# Patient Record
Sex: Male | Born: 1940 | Race: White | Hispanic: No | Marital: Married | Smoking: Never smoker
Health system: Southern US, Community
[De-identification: ages and names within clinical notes are randomized; demographics above are authoritative.]

## PROBLEM LIST (undated history)

## (undated) DIAGNOSIS — B029 Zoster without complications: Secondary | ICD-10-CM

## (undated) DIAGNOSIS — I313 Pericardial effusion (noninflammatory): Secondary | ICD-10-CM

## (undated) DIAGNOSIS — I712 Thoracic aortic aneurysm, without rupture, unspecified: Secondary | ICD-10-CM

## (undated) DIAGNOSIS — G231 Progressive supranuclear ophthalmoplegia [Steele-Richardson-Olszewski]: Secondary | ICD-10-CM

## (undated) DIAGNOSIS — K810 Acute cholecystitis: Secondary | ICD-10-CM

## (undated) DIAGNOSIS — I776 Arteritis, unspecified: Secondary | ICD-10-CM

## (undated) DIAGNOSIS — M316 Other giant cell arteritis: Secondary | ICD-10-CM

## (undated) HISTORY — DX: Pericardial effusion (noninflammatory): I31.3

## (undated) HISTORY — PX: KNEE SURGERY: SHX244

## (undated) HISTORY — DX: Other giant cell arteritis: M31.6

## (undated) HISTORY — PX: TONSILLECTOMY: SUR1361

## (undated) HISTORY — PX: PERICARDIAL WINDOW: SHX2213

## (undated) HISTORY — DX: Acute cholecystitis: K81.0

## (undated) HISTORY — DX: Progressive supranuclear ophthalmoplegia (steele-Richardson-olszewski): G23.1

## (undated) HISTORY — DX: Arteritis, unspecified: I77.6

## (undated) HISTORY — DX: Thoracic aortic aneurysm, without rupture: I71.2

## (undated) HISTORY — DX: Thoracic aortic aneurysm, without rupture, unspecified: I71.20

## (undated) HISTORY — PX: CHOLECYSTECTOMY: SHX55

## (undated) HISTORY — DX: Zoster without complications: B02.9

---

## 2001-05-04 DIAGNOSIS — B029 Zoster without complications: Secondary | ICD-10-CM

## 2001-05-04 HISTORY — DX: Zoster without complications: B02.9

## 2002-08-03 HISTORY — PX: NASAL SEPTOPLASTY W/ TURBINOPLASTY: SHX2070

## 2006-08-03 HISTORY — PX: FASCIECTOMY: SHX6525

## 2013-07-02 DIAGNOSIS — K810 Acute cholecystitis: Secondary | ICD-10-CM

## 2013-07-02 HISTORY — DX: Acute cholecystitis: K81.0

## 2013-07-13 LAB — HEPATIC FUNCTION PANEL
ALT: 24 U/L (ref 10–40)
AST: 40 U/L (ref 14–40)
Alkaline Phosphatase: 195 U/L — AB (ref 25–125)
Bilirubin, Total: 1.3 mg/dL

## 2013-07-13 LAB — HEMOGLOBIN A1C: Hgb A1c MFr Bld: 6.3 % — AB (ref 4.0–6.0)

## 2013-07-13 LAB — BASIC METABOLIC PANEL
Creatinine: 0.8 mg/dL (ref 0.6–1.3)
GLUCOSE: 112 mg/dL

## 2013-07-13 LAB — LIPID PANEL
Cholesterol: 184 mg/dL (ref 0–200)
HDL: 35 mg/dL (ref 35–70)
LDL Cholesterol: 112 mg/dL
TRIGLYCERIDES: 160 mg/dL (ref 40–160)

## 2013-07-20 DIAGNOSIS — I3139 Other pericardial effusion (noninflammatory): Secondary | ICD-10-CM

## 2013-07-20 DIAGNOSIS — I313 Pericardial effusion (noninflammatory): Secondary | ICD-10-CM

## 2013-07-20 DIAGNOSIS — I776 Arteritis, unspecified: Secondary | ICD-10-CM

## 2013-07-20 HISTORY — DX: Pericardial effusion (noninflammatory): I31.3

## 2013-07-20 HISTORY — DX: Other pericardial effusion (noninflammatory): I31.39

## 2013-07-20 HISTORY — DX: Arteritis, unspecified: I77.6

## 2013-09-29 DIAGNOSIS — M316 Other giant cell arteritis: Secondary | ICD-10-CM

## 2013-09-29 HISTORY — DX: Other giant cell arteritis: M31.6

## 2014-06-19 ENCOUNTER — Encounter: Payer: Self-pay | Admitting: Family Medicine

## 2014-06-19 ENCOUNTER — Ambulatory Visit (INDEPENDENT_AMBULATORY_CARE_PROVIDER_SITE_OTHER): Payer: 59 | Admitting: Family Medicine

## 2014-06-19 VITALS — BP 123/76 | HR 86 | Wt 158.0 lb

## 2014-06-19 DIAGNOSIS — R918 Other nonspecific abnormal finding of lung field: Secondary | ICD-10-CM

## 2014-06-19 DIAGNOSIS — Z8679 Personal history of other diseases of the circulatory system: Secondary | ICD-10-CM

## 2014-06-19 DIAGNOSIS — Z23 Encounter for immunization: Secondary | ICD-10-CM

## 2014-06-19 DIAGNOSIS — I872 Venous insufficiency (chronic) (peripheral): Secondary | ICD-10-CM

## 2014-06-19 DIAGNOSIS — G231 Progressive supranuclear ophthalmoplegia [Steele-Richardson-Olszewski]: Secondary | ICD-10-CM | POA: Insufficient documentation

## 2014-06-19 DIAGNOSIS — G4733 Obstructive sleep apnea (adult) (pediatric): Secondary | ICD-10-CM

## 2014-06-19 DIAGNOSIS — R1312 Dysphagia, oropharyngeal phase: Secondary | ICD-10-CM | POA: Insufficient documentation

## 2014-06-19 DIAGNOSIS — I3139 Other pericardial effusion (noninflammatory): Secondary | ICD-10-CM | POA: Insufficient documentation

## 2014-06-19 DIAGNOSIS — G4700 Insomnia, unspecified: Secondary | ICD-10-CM | POA: Insufficient documentation

## 2014-06-19 DIAGNOSIS — M720 Palmar fascial fibromatosis [Dupuytren]: Secondary | ICD-10-CM

## 2014-06-19 DIAGNOSIS — I776 Arteritis, unspecified: Secondary | ICD-10-CM

## 2014-06-19 DIAGNOSIS — Z8739 Personal history of other diseases of the musculoskeletal system and connective tissue: Secondary | ICD-10-CM | POA: Insufficient documentation

## 2014-06-19 DIAGNOSIS — I313 Pericardial effusion (noninflammatory): Secondary | ICD-10-CM | POA: Insufficient documentation

## 2014-06-19 DIAGNOSIS — G608 Other hereditary and idiopathic neuropathies: Secondary | ICD-10-CM

## 2014-06-19 DIAGNOSIS — E119 Type 2 diabetes mellitus without complications: Secondary | ICD-10-CM | POA: Insufficient documentation

## 2014-06-19 DIAGNOSIS — I319 Disease of pericardium, unspecified: Secondary | ICD-10-CM

## 2014-06-19 LAB — POCT GLYCOSYLATED HEMOGLOBIN (HGB A1C): HEMOGLOBIN A1C: 6

## 2014-06-19 NOTE — Assessment & Plan Note (Signed)
Currently being treated with Ambien as recommended by the movement disorder clinic.

## 2014-06-19 NOTE — Assessment & Plan Note (Signed)
Well-controlled on current regimen. He would A1c of 6.0 today which is fantastic. Follow up in 3 months. Hopefully we'll be able to discontinue insulin in the next couple of weeks.

## 2014-06-19 NOTE — Assessment & Plan Note (Signed)
Follows every 3 months with cardiology. Last seen a month ago. We'll need to place referral and get him in here locally.

## 2014-06-19 NOTE — Assessment & Plan Note (Signed)
Has appointment later this month with movement disorder clinic for continued treatment and evaluation. [facility].

## 2014-06-19 NOTE — Assessment & Plan Note (Signed)
We'll complete [facility] tomorrow. Continue to manage her glucose over the next couple of weeks. Hopefully he'll be able to come off of insulin completely.

## 2014-06-19 NOTE — Progress Notes (Signed)
Subjective:    Patient ID: Justin Day, male    DOB: 14-Sep-1940, 74 y.o.   MRN: [id number]  HPI 74 year old white male with a diagnosis of supranuclear palsy. He was initially diagnosed with Parkinson's disease and started on Sinemet in April 2011. The Sinemet was recently discontinued as he was officially diagnosed with supranuclear palsy. He was seen at the movement disorder clinic [facility] [place]. 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 His wife has to cut his food up and he still has difficulty sometimes with swallowing coughing and choking. 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 He has been on chronic prednisone since then but action takes his last dose tomorrow. He does need to establish with a cardiology locally. He was being seen every 3 months. He are he has referral placed to the movement disorder [facility] with Dr. Farrel ConnersHage later this month.  Diabetes-his wife says his diabetes has been well controlled with typically an A1c under 6.5 until he was started on prednisone in April. Since then he's been using Novolin 70/30 sliding scale. She is hoping he will be able to discontinue the same.  Sleep apnea-he does use his CPAP regularly. He also takes 500 g of Ambien at night as well as Provigil in the morning which was recommended by the movement disorder clinic. Next  He also takes vitamin B12 and vitamin D as well as calcium since he is vegetarian. It is not specifically for a deficiency. Next  His wife is also requesting a handicap sticker form be completed today.   Review of Systems  Constitutional: Negative for fever, diaphoresis and unexpected weight change.  HENT: Negative for hearing loss, rhinorrhea, sneezing and tinnitus.   Eyes: Negative for visual disturbance.  Respiratory: Negative for cough and wheezing.   Cardiovascular: Negative for chest pain and palpitations.   Gastrointestinal: Negative for nausea, vomiting, diarrhea and blood in stool.  Genitourinary: Negative for dysuria and discharge.  Musculoskeletal: Negative for myalgias and arthralgias.  Skin: Negative for rash.  Neurological: Negative for headaches.  Hematological: Negative for adenopathy.  Psychiatric/Behavioral: Positive for confusion. Negative for sleep disturbance and dysphoric mood. The patient is not nervous/anxious.    BP 123/76 mmHg  Pulse 86  Wt 158 lb (71.668 kg)  SpO2 98%    Allergies  Allergen Reactions  . Peanut-Containing Drug Products Anaphylaxis    No past medical history on file.  No past surgical history on file.  History   Social History  . Marital Status: Unknown    Spouse Name: N/A  . Number of Children: N/A  . Years of Education: N/A   Occupational History  . retired    Social History Main Topics  . Smoking status: Not on file  . Smokeless tobacco: Not on file  . Alcohol Use: Not on file  . Drug Use: Not on file  . Sexual Activity: Not on file   Other Topics Concern  . Not on file   Social History Narrative   Retired. History of one year of college. Married to Justin Day. 3 adult children. Recently moved from [place].    Family History  Problem Relation Age of Onset  . Breast cancer Mother   . Heart attack Father   . Diabetes Father   . Hyperlipidemia Father   . Hypertension Father     Outpatient Encounter Prescriptions as of 06/19/2014  Medication Sig  . aspirin 81  MG tablet Take 81 mg by mouth daily.  . Calcium Carbonate-Vit D-Min (CALCIUM 1200 PO) Take by mouth.  . Cholecalciferol (D 1000 PO) Take by mouth.  . Cyanocobalamin (VITAMIN B12 PO) Take by mouth.  . fluocinonide (LIDEX) 0.05 % external solution Apply 1 application topically 2 (two) times daily.  Marland Kitchen ipratropium (ATROVENT) 0.03 % nasal spray Place 2 sprays into both nostrils every 12 (twelve) hours.  . memantine (NAMENDA XR) 14 MG CP24 24 hr capsule Take 14 mg by mouth.   . modafinil (PROVIGIL) 200 MG tablet Take 200 mg by mouth daily.  . predniSONE (DELTASONE) 1 MG tablet Take 1 mg by mouth daily with breakfast.  . zolpidem (AMBIEN) 5 MG tablet Take 5 mg by mouth at bedtime as needed for sleep.          Objective:   Physical Exam  Constitutional: He is oriented to person, place, and time. He appears well-developed and well-nourished.  HENT:  Head: Normocephalic and atraumatic.  Neck: Neck supple. No thyromegaly present.  Cardiovascular: Normal rate, regular rhythm and normal heart sounds.   No carotid bruits  Pulmonary/Chest: Effort normal and breath sounds normal.  Lymphadenopathy:    He has no cervical adenopathy.  Neurological: He is alert and oriented to person, place, and time.  Skin: Skin is warm and dry.  Psychiatric: He has a normal mood and affect. His behavior is normal.          Assessment & Plan:  Prevnar 13 given today here in the office.

## 2014-06-22 ENCOUNTER — Encounter: Payer: Self-pay | Admitting: Family Medicine

## 2014-06-26 ENCOUNTER — Encounter: Payer: Self-pay | Admitting: Family Medicine

## 2014-06-26 DIAGNOSIS — I714 Abdominal aortic aneurysm, without rupture, unspecified: Secondary | ICD-10-CM | POA: Insufficient documentation

## 2014-06-26 DIAGNOSIS — R918 Other nonspecific abnormal finding of lung field: Secondary | ICD-10-CM | POA: Insufficient documentation

## 2014-07-04 ENCOUNTER — Encounter: Payer: Self-pay | Admitting: Family Medicine

## 2014-07-17 ENCOUNTER — Encounter: Payer: Self-pay | Admitting: Family Medicine

## 2014-08-08 ENCOUNTER — Encounter: Payer: Self-pay | Admitting: Cardiology

## 2014-08-08 ENCOUNTER — Ambulatory Visit (INDEPENDENT_AMBULATORY_CARE_PROVIDER_SITE_OTHER): Payer: 59 | Admitting: Cardiology

## 2014-08-08 VITALS — BP 134/72 | HR 88 | Ht 67.0 in | Wt 158.0 lb

## 2014-08-08 DIAGNOSIS — I3139 Other pericardial effusion (noninflammatory): Secondary | ICD-10-CM

## 2014-08-08 DIAGNOSIS — R911 Solitary pulmonary nodule: Secondary | ICD-10-CM

## 2014-08-08 DIAGNOSIS — I313 Pericardial effusion (noninflammatory): Secondary | ICD-10-CM

## 2014-08-08 DIAGNOSIS — I319 Disease of pericardium, unspecified: Secondary | ICD-10-CM

## 2014-08-08 DIAGNOSIS — I776 Arteritis, unspecified: Secondary | ICD-10-CM | POA: Diagnosis not present

## 2014-08-08 DIAGNOSIS — I712 Thoracic aortic aneurysm, without rupture, unspecified: Secondary | ICD-10-CM | POA: Insufficient documentation

## 2014-08-08 DIAGNOSIS — R918 Other nonspecific abnormal finding of lung field: Secondary | ICD-10-CM

## 2014-08-08 NOTE — Assessment & Plan Note (Signed)
Plan repeat CTA. 

## 2014-08-08 NOTE — Addendum Note (Signed)
Addended by: Freddi StarrMATHIS, Tristram Milian W on: 08/08/2014 02:43 PM   Modules accepted: Orders

## 2014-08-08 NOTE — Progress Notes (Signed)
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 Previously cared for in [place]. Nuclear study September 2006 showed ejection fraction 71% and normal perfusion. Patient had a pericardiocentesis in March 2015 and subsequently pericardial window for recurrent pericardial effusion/tamponade. 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 He was followed by rheumatology previously for this issue. Pathology negative on fluid. Note TEE at time of pericardiocentesis showed EF 35-40 and mild MR and TR. Chest CT April 2015 showed stable thoracic aortic aneurysm with thickening around ascending aorta question aortitis or intramural hematoma, scattered lung nodules with follow-up recommended in one year, question nodule left lobe of liver. Patient also with progressive supranuclear palsy. Last echocardiogram July 2015 showed normal LV function and no pericardial effusion. Mild aortic insufficiency. Patient denies dyspnea, chest pain, palpitations or syncope.  Current Outpatient Prescriptions  Medication Sig Dispense Refill  . aspirin 81 MG tablet Take 81 mg by mouth daily.    . Calcium Carbonate-Vit D-Min (CALCIUM 1200 PO) Take by mouth.    . Cholecalciferol (D 1000 PO) Take by mouth.    . Cyanocobalamin (VITAMIN B12 PO) Take by mouth.    . fluocinonide (LIDEX) 0.05 % external solution Apply 1 application topically 2 (two) times daily.    Marland Kitchen. ipratropium (ATROVENT) 0.03 % nasal spray Place 2 sprays into both nostrils every 12 (twelve) hours.    . memantine (NAMENDA XR) 14 MG CP24 24 hr capsule Take 14 mg by mouth.    . modafinil (PROVIGIL) 200 MG tablet Take 200 mg by mouth daily.    Marland Kitchen. zolpidem (AMBIEN) 5 MG tablet Take 5 mg by mouth at bedtime as needed for sleep.     No current facility-administered medications for this visit.    Allergies  Allergen Reactions  . Peanut-Containing Drug Products Anaphylaxis      Past Medical History  Diagnosis Date  . 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 Temporal arteritis 09/29/13  . Pericardial effusion 07/20/13  . Gangrenous cholecystitis 07/02/13  . Shingles 2003  . Pericardial effusion   . Progressive supranuclear palsy   . Thoracic aortic aneurysm     Past Surgical History  Procedure Laterality Date  . Fasciectomy  08/2006    Hand Dupuytrens, left   . Nasal septoplasty w/ turbinoplasty  08/2002  . Knee surgery    . Pericardial window    . Tonsillectomy    . Cholecystectomy      History   Social History  . Marital Status: Married    Spouse Name: N/A  . Number of Children: N/A  . Years of Education: N/A   Occupational History  . retired    Social History Main Topics  . Smoking status: Never Smoker   . Smokeless tobacco: Not on file  . Alcohol Use: No  . Drug Use: No  . Sexual Activity: Not Currently   Other Topics Concern  . Not on file   Social History Narrative   Retired. History of one year of college. Married to [place]. 3 adult children. Recently moved from [place]. Some exercise on the bike for 20 minutes once daily.    Family History  Problem Relation Age of Onset  . Breast cancer Mother   . Heart attack Father   . Diabetes Father   . Hyperlipidemia Father   . Hypertension Father     ROS: no fevers or chills, productive cough, hemoptysis, dysphasia, odynophagia, melena, hematochezia, dysuria, hematuria, rash,  seizure activity, orthopnea, PND, pedal edema, claudication. Remaining systems are negative.  Physical Exam:   Blood pressure 134/72, pulse 88, height  (1.702 m), weight 158 lb (71.668 kg).  General:  Well developed/well nourished in NAD Skin warm/dry Patient not depressed No peripheral clubbing Back-normal HEENT-normal/normal eyelids Neck supple/normal carotid upstroke bilaterally; no bruits; no JVD; no thyromegaly chest - CTA/ normal expansion CV - RRR/normal S1 and S2; no murmurs, rubs or gallops;  PMI  nondisplaced Abdomen -NT/ND, no HSM, no mass, + bowel sounds, no bruit 2+ femoral pulses, no bruits Ext-no edema, chords, 2+ DP Neuro-flat affect. Symptoms consistent with supranuclear palsy  ECG normal sinus rhythm at a rate of 88. No ST changes.

## 2014-08-08 NOTE — Patient Instructions (Signed)
Your physician wants you to follow-up in: 6 MONTHS WITH DR Jens SomRENSHAW You will receive a reminder letter in the mail two months in advance. If you don't receive a letter, please call our office to schedule the follow-up appointment.   Your physician has requested that you have an echocardiogram. Echocardiography is a painless test that uses sound waves to create images of your heart. It provides your doctor with information about the size and shape of your heart and how well your heart's chambers and valves are working. This procedure takes approximately one hour. There are no restrictions for this procedure.  CTA OF THE CHEST W/WO CONTRAST TO F/U LUNG NODULES AND LIVER NODULE

## 2014-08-08 NOTE — Assessment & Plan Note (Addendum)
Previous arteritis diagnosed by temporal artery biopsy. His steroids have been weaned to off. I have asked him to establish with a rheumatologist [place] in [place]. Approximately 30 minutes spent reviewing previous records.

## 2014-08-08 NOTE — Assessment & Plan Note (Signed)
History of recurrent pericardial effusion status post pericardial window. Plan repeat echocardiogram to reassess.

## 2014-08-08 NOTE — Assessment & Plan Note (Addendum)
Previous nodules on CT scan by report. Repeat chest CT. We will include the liver as previous CT suggested possible nodule in left lobe of liver.

## 2014-08-09 LAB — BASIC METABOLIC PANEL WITH GFR
BUN: 15 mg/dL (ref 6–23)
CALCIUM: 9.4 mg/dL (ref 8.4–10.5)
CO2: 30 meq/L (ref 19–32)
CREATININE: 0.65 mg/dL (ref 0.50–1.35)
Chloride: 101 mEq/L (ref 96–112)
GFR, Est African American: >89 mL/min
GFR, Est Non African American: >89 mL/min
Glucose, Bld: 119 mg/dL — ABNORMAL HIGH (ref 70–99)
Potassium: 4.9 mEq/L (ref 3.5–5.3)
SODIUM: 139 meq/L (ref 135–145)

## 2014-08-20 ENCOUNTER — Ambulatory Visit (INDEPENDENT_AMBULATORY_CARE_PROVIDER_SITE_OTHER)
Admission: RE | Admit: 2014-08-20 | Discharge: 2014-08-20 | Disposition: A | Discharge status: Home / Self Care | Payer: Medicare PPO | Source: Ambulatory Visit | Attending: Cardiology | Admitting: Cardiology

## 2014-08-20 ENCOUNTER — Ambulatory Visit (HOSPITAL_COMMUNITY): Payer: Medicare PPO

## 2014-08-20 DIAGNOSIS — I712 Thoracic aortic aneurysm, without rupture, unspecified: Secondary | ICD-10-CM

## 2014-08-20 DIAGNOSIS — R911 Solitary pulmonary nodule: Secondary | ICD-10-CM

## 2014-08-20 MED ORDER — IOHEXOL 350 MG/ML SOLN
100.0000 mL | Freq: Once | INTRAVENOUS | Status: AC | PRN
  Administered 2014-08-20: 100 mL via INTRAVENOUS

## 2014-08-22 ENCOUNTER — Other Ambulatory Visit: Payer: Self-pay | Admitting: *Deleted

## 2014-08-22 DIAGNOSIS — I712 Thoracic aortic aneurysm, without rupture, unspecified: Secondary | ICD-10-CM

## 2014-08-23 ENCOUNTER — Other Ambulatory Visit: Payer: Self-pay | Admitting: *Deleted

## 2014-08-23 DIAGNOSIS — I712 Thoracic aortic aneurysm, without rupture, unspecified: Secondary | ICD-10-CM

## 2014-08-29 ENCOUNTER — Institutional Professional Consult (permissible substitution) (INDEPENDENT_AMBULATORY_CARE_PROVIDER_SITE_OTHER): Payer: Medicare PPO | Admitting: Cardiothoracic Surgery

## 2014-08-29 ENCOUNTER — Encounter: Payer: Self-pay | Admitting: Cardiothoracic Surgery

## 2014-08-29 VITALS — BP 130/78 | HR 86 | Resp 20 | Ht 67.0 in | Wt 158.0 lb

## 2014-08-29 DIAGNOSIS — I712 Thoracic aortic aneurysm, without rupture, unspecified: Secondary | ICD-10-CM

## 2014-08-29 DIAGNOSIS — I776 Arteritis, unspecified: Secondary | ICD-10-CM | POA: Diagnosis not present

## 2014-08-29 NOTE — Progress Notes (Signed)
PCP is METHENEY,CATHERINE, MD Referring Provider is Lewayne Bunting, MD  Chief Complaint  Patient presents with  . 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 The patient has a chronic debilitating dementia and extremely poor functional capacity from supranuclear palsy. He has been evaluated at [facility] department neurology. The patient was diagnosed with temporal arteritis by biopsy in [place] last spring. He was placed on prednisone which was continued and tapered off this past fall. The patient's vision has been deteriorating and he can no longer read books. Prior to his temporal artery biopsy developed pericarditis which was treated initially with a pericardiocentesis and a subxiphoid pericardial window without recurrence.followup the echocardiogram showed no recurrent pericardial effusion. Patient subsequently moved from [place] to this area. The patient was seen in consultation by Dr. Jens Som obtain a CTA of his thoracic aorta and ordered a echocardiogram which is yet to be completed.the patient has established primary care with Dr. Monia Sabal. 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 The patient apparently had significant side effects from his prednisone especially at the higher doses. He has not had a sed rate since his prednisone was weaned off.  The patient has required total care by his wife. At one point he was enrolled in a hospice program in [place]. The patient does not wish any more tubes or surgical therapy. Prior to developing supranuclear palsy he was a very active  healthy man-vegetarian never smoker.   Past Medical History  Diagnosis Date  . 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 Temporal arteritis 09/29/13  . Pericardial effusion 07/20/13  . Gangrenous cholecystitis 07/02/13  . Shingles 2003  . Pericardial effusion   . Progressive supranuclear palsy   . Thoracic aortic aneurysm     Past Surgical History  Procedure Laterality Date  . Fasciectomy  08/2006    Hand Dupuytrens, left   . Nasal septoplasty w/ turbinoplasty  08/2002  . Knee surgery    . Pericardial window    . Tonsillectomy    . Cholecystectomy      Family History  Problem Relation Age of Onset  . Breast cancer Mother   . Heart attack Father   . Diabetes Father   . Hyperlipidemia Father   . Hypertension Father     Social History History  Substance Use Topics  . Smoking status: Never Smoker   . Smokeless tobacco: Not on file  . Alcohol Use: No    Current Outpatient Prescriptions  Medication Sig Dispense Refill  . aspirin 81 MG tablet Take 81 mg by mouth daily.    . Cholecalciferol (D 1000 PO) Take by mouth.    . Cyanocobalamin (VITAMIN B12 PO) Take by mouth.    . fluocinonide (LIDEX) 0.05 % external solution Apply 1 application topically 2 (two) times daily.    Marland Kitchen ipratropium (ATROVENT) 0.03 % nasal spray Place 2 sprays into both nostrils every 12 (twelve) hours.    . memantine (NAMENDA XR) 14 MG CP24 24 hr capsule Take 14 mg by mouth.    . modafinil (PROVIGIL) 200 MG tablet Take 200 mg by mouth daily.    Marland Kitchen zolpidem (AMBIEN)  5 MG tablet Take 5 mg by mouth at bedtime as needed for sleep.     No current facility-administered medications for this visit.    Allergies  Allergen Reactions  . Peanut-Containing Drug Products Anaphylaxis    Review of Systems  Gen.-total disability from the neurologic disease with frequent falls HEENT-difficulty swallowing with aspiration risk Pulmonary-positive for sleep apnea no previous thoracic surgical procedures Cardiac-echo shows EF 40% with mild  MR, history of previous pericarditis, resolved GI-status post cholecystectomy for gangrenous cholecystitis last year Endocrine-positive for diabetes well-controlled Hematologic-no history of bleeding or thrombotic problems Neurologic-no stroke but severe neurologic impairment from supranuclear palsy with abnormal uncontrolled movement  BP 130/78 mmHg  Pulse 86  Resp 20  Ht 5\' 7"  (1.702 m)  Wt 158 lb (71.668 kg)  BMI 24.74 kg/m2  SpO2 96% Physical Exam  General: thin Caucasian male minimally interactive accompanied by wife and family HEENT: Normocephalic pupils equal , dentition adequate Neck: Supple without JVD, adenopathy, or bruit Chest: Clear to auscultation, symmetrical breath sounds, no rhonchi, no tenderness             or deformity Cardiovascular: Regular rate and rhythm, no murmur, no gallop, peripheral pulses             palpable in all extremities Abdomen:  Soft, nontender, no palpable mass or organomegaly Extremities: Warm, well-perfused, no clubbing cyanosis edema or tenderness,              no venous stasis changes of the legs-extremities are fairly stiff Rectal/GU: Deferred Neuro: flat affect, uncontrolled movements, chronic debilitating neurologic syndrome Skin: Clean and dry without rash or ulceration   Diagnostic Tests: CTA reviewed showing the aortitis with an extremely thickened ascending aortic wall with a lumen measuring 3.6 cm. No evidence of hematoma or dissection.  Impression: The patient's thoracic aortic disease is significant but cannot be managed surgically because of the underlying severe medical problems. Evaluation by a rheumatologist to consider restarting low-dose prednisone may help his aortitis and prevent further complications of stroke from atheroemboli or dissection. However the main goal for this patient is quality of life so if the side effects of prednisone are significant then that would need to be reassessed.  We will follow his thoracic  aortic disease with serial annual CTA's, not to direct surgical therapy but to provide information to his family regarding risk for sudden death from dissection.   Plan:return in one year with CTA of the thoracic aorta  Mikey BussingPeter Van Trigt III, MD [facility] [phone]

## 2014-08-30 ENCOUNTER — Telehealth: Payer: Self-pay | Admitting: Family Medicine

## 2014-08-30 DIAGNOSIS — I712 Thoracic aortic aneurysm, without rupture, unspecified: Secondary | ICD-10-CM

## 2014-08-30 DIAGNOSIS — I776 Arteritis, unspecified: Secondary | ICD-10-CM

## 2014-08-30 NOTE — Telephone Encounter (Signed)
Call pt: received Dr. Aletha HalimVan trights notes.  He recommended we get Peder estab with a rheumatologist.  Please speak with is wife who is his pirmary care taker and see if she is ok with this. We should also check a sed rate in teh labs.

## 2014-09-03 NOTE — Telephone Encounter (Signed)
Pt's wife advised she would like to see someone in the [facility] network.

## 2014-09-05 LAB — CBC WITH DIFFERENTIAL/PLATELET
BASOS PCT: 0 % (ref 0–1)
Basophils Absolute: 0 10*3/uL (ref 0.0–0.1)
EOS ABS: 0.1 10*3/uL (ref 0.0–0.7)
EOS PCT: 1 % (ref 0–5)
HCT: 42 % (ref 39.0–52.0)
Hemoglobin: 14.3 g/dL (ref 13.0–17.0)
Lymphocytes Relative: 19 % (ref 12–46)
Lymphs Abs: 1.8 10*3/uL (ref 0.7–4.0)
MCH: 27.9 pg (ref 26.0–34.0)
MCHC: 34 g/dL (ref 30.0–36.0)
MCV: 82 fL (ref 78.0–100.0)
MPV: 9.2 fL (ref 8.6–12.4)
Monocytes Absolute: 0.8 10*3/uL (ref 0.1–1.0)
Monocytes Relative: 8 % (ref 3–12)
Neutro Abs: 6.9 10*3/uL (ref 1.7–7.7)
Neutrophils Relative %: 72 % (ref 43–77)
PLATELETS: 466 10*3/uL — AB (ref 150–400)
RBC: 5.12 MIL/uL (ref 4.22–5.81)
RDW: 13.5 % (ref 11.5–15.5)
WBC: 9.6 10*3/uL (ref 4.0–10.5)

## 2014-09-05 LAB — SEDIMENTATION RATE: SED RATE: 36 mm/h — AB (ref 0–20)

## 2014-09-07 ENCOUNTER — Encounter: Payer: Self-pay | Admitting: Family Medicine

## 2014-09-07 ENCOUNTER — Ambulatory Visit (INDEPENDENT_AMBULATORY_CARE_PROVIDER_SITE_OTHER): Payer: Medicare PPO | Admitting: Family Medicine

## 2014-09-07 VITALS — BP 127/73 | HR 88 | Temp 97.9°F | Wt 159.0 lb

## 2014-09-07 DIAGNOSIS — M316 Other giant cell arteritis: Secondary | ICD-10-CM

## 2014-09-07 NOTE — Progress Notes (Signed)
CC: Justin Day is a 74 y.o. male is here for Fever   Subjective: HPI:   59 of history is provided by patient's wife.  She reports the patient has had a fever daily for the past 2 weeks. Maximum temperature 100.6. It seems to only occur later in the day. Patient states that he feels like he is in his regular state of health other than some fatigue. Patient denies any other complaints other than fatigue. Wife states that he has not had any verbal complaints other than fatigue over these past 2 weeks. No interventions as of yet. Nothing seems to make the fever better or worse. It sounds like this combination of symptoms was present when he was originally diagnosed with temporal arteritis. He recently met with a cardiothoracic surgeon and per the wife's report he was advised to restart prednisone for treatment of temporal arteritis due to concerns that this was active and a contributor to his aortic aneurysm. Wife has not started yet because she is concerned with his recent fevers that some infection may be present. He denies any chills, cough, wheezing, chest pain, shortness of breath, abdominal pain, diarrhea, constipation, sore throat, nasal congestion, dysuria nor urinary frequency. Denies any skin changes.  Review Of Systems Outlined In HPI  Past Medical History  Diagnosis Date  . 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 Temporal arteritis 09/29/13  . Pericardial effusion 07/20/13  . Gangrenous cholecystitis 07/02/13  . Shingles 2003  . Pericardial effusion   . Progressive supranuclear palsy   . Thoracic aortic aneurysm     Past Surgical History  Procedure Laterality Date  . Fasciectomy  08/2006    Hand Dupuytrens, left   . Nasal septoplasty w/ turbinoplasty  08/2002  . Knee surgery    . Pericardial window    . Tonsillectomy    . Cholecystectomy     Family History  Problem Relation Age of Onset  . Breast cancer Mother   . Heart attack Father   . Diabetes Father   . Hyperlipidemia Father   .  Hypertension Father     History   Social History  . Marital Status: Married    Spouse Name: N/A  . Number of Children: N/A  . Years of Education: N/A   Occupational History  . retired    Social History Main Topics  . Smoking status: Never Smoker   . Smokeless tobacco: Not on file  . Alcohol Use: No  . Drug Use: No  . Sexual Activity: Not Currently   Other Topics Concern  . Not on file   Social History Narrative   Retired. History of one year of college. Married to Justin Day. 3 adult children. Recently moved from [place]. Some exercise on the bike for 20 minutes once daily.     Objective: BP 127/73 mmHg  Pulse 88  Temp(Src) 97.9 F (36.6 C) (Oral)  Wt 159 lb (72.122 kg)  General: Alert and Oriented, No Acute Distress HEENT: Pupils equal, round, reactive to light. Conjunctivae clear.  External ears unremarkable, canals clear with intact TMs with appropriate landmarks.  Middle ear appears open without effusion. Pink inferior turbinates.  Moist mucous membranes, pharynx without inflammation nor lesions.  Neck supple without palpable lymphadenopathy nor abnormal masses. Lungs: Clear to auscultation bilaterally, no wheezing/ronchi/rales.  Comfortable work of breathing. Good air movement. Cardiac: Regular rate and rhythm. Normal S1/S2.  No murmurs, rubs, nor gallops.   Abdomen: Normal bowel sounds, soft and non tender without palpable masses. No guarding rigidity or  rebound tenderness Extremities: No peripheral edema.  Strong peripheral pulses.  Mental Status: interactive but flat affect Skin: Warm and dry.  Assessment & Plan: Justin Day was seen today for fever.  Diagnoses and all orders for this visit:  Temporal arteritis   Recent and remote records have been reviewed. White count was not elevated earlier this week which lowers my suspicion of an infection causing his fevers with it being low grade. I believe that the fever fatigue and inflammatory changes on his blood work is  due to arteritis and I recommend that he restart taking prednisone until he can see rheumatology. Rheumatology referral seems to have been at a standstill so I have  Alerted our front desk. He had intolerable side effects with respect to behavioral side effects with 60 mg of prednisone and therefore starting at 66mlligrams daily which the family already has plenty of at home. Signs and symptoms requring emergent/urgent reevaluation were discussed with the patient.call if no better by Monday  40 minutes spent face-to-face during visit today of which at least 50% was counseling or coordinating care regarding: 1. Temporal arteritis      Return if symptoms worsen or fail to improve.

## 2014-09-10 NOTE — Telephone Encounter (Signed)
Referral placed.

## 2014-09-14 ENCOUNTER — Ambulatory Visit (INDEPENDENT_AMBULATORY_CARE_PROVIDER_SITE_OTHER): Payer: Medicare PPO | Admitting: Family Medicine

## 2014-09-14 ENCOUNTER — Encounter: Payer: Self-pay | Admitting: Family Medicine

## 2014-09-14 VITALS — BP 123/74 | HR 84 | Wt 156.0 lb

## 2014-09-14 DIAGNOSIS — Z8739 Personal history of other diseases of the musculoskeletal system and connective tissue: Secondary | ICD-10-CM

## 2014-09-14 DIAGNOSIS — E119 Type 2 diabetes mellitus without complications: Secondary | ICD-10-CM

## 2014-09-14 DIAGNOSIS — I776 Arteritis, unspecified: Secondary | ICD-10-CM | POA: Diagnosis not present

## 2014-09-14 DIAGNOSIS — T380X5A Adverse effect of glucocorticoids and synthetic analogues, initial encounter: Secondary | ICD-10-CM

## 2014-09-14 DIAGNOSIS — E099 Drug or chemical induced diabetes mellitus without complications: Secondary | ICD-10-CM | POA: Diagnosis not present

## 2014-09-14 LAB — POCT GLYCOSYLATED HEMOGLOBIN (HGB A1C): Hemoglobin A1C: 6.6

## 2014-09-14 MED ORDER — INSULIN PEN NEEDLE 31G X 6 MM MISC: Status: AC

## 2014-09-14 MED ORDER — FLUOCINONIDE 0.05 % EX SOLN: 1.0000 "application " | Freq: Two times a day (BID) | CUTANEOUS | Status: DC

## 2014-09-14 MED ORDER — ZOLPIDEM TARTRATE 5 MG PO TABS: 5.0000 mg | ORAL_TABLET | Freq: Every evening | ORAL | Status: DC | PRN

## 2014-09-14 NOTE — Progress Notes (Signed)
   Subjective:    Patient ID: Justin ReekDavid Day, male    DOB: October 15, 1940, 74 y.o.   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 Zenaida NieceVan trite. He did not recommend surgery for the aortitis.. Repeat sedimentation rate was 36. He was started on prednisone again about a week ago, and now his blood sugars are climbing back up.  She has been checking his sugars. AM sugars are good and the pe- evening mealtime sugars are running in the 200s. Bedtime is is running around 200s.  She is using Novolog which she previously had a prescription for. She has been using a sliding scale.  No change in enery.  Has been more irritable. She does need a new perception for the needles for the pins.  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  His wife says she has noticed some improvement.   Have place rheumatology referral.  Call if don't have an appt info by Tuesday. We placed referral on Monday. 2 [facility] to see Dr. Titus Dubinevashwar.     Review of Systems     Objective:   Physical Exam  Constitutional: He is oriented to person, place, and time. He appears well-developed and well-nourished.  HENT:  Head: Normocephalic and atraumatic.  Small bruise over hte left facial cheek  Cardiovascular: Normal rate, regular rhythm and normal heart sounds.   Pulmonary/Chest: Effort normal and breath sounds normal.  Neurological: He is alert and oriented to person, place, and time.  Skin: Skin is warm and dry.  Psychiatric: He has a normal mood and affect. His behavior is normal.          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 Unfortunately this is causing significant elevation in his blood sugars. See note below. We'll continue to follow. Hefley we can get him in with rheumatology in the next month or 2.  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 Will use that is her baseline. We discussed the options of switching to long-acting insulin at bedtime especially since he  is going to be on the prednisone for most likely a accident course. She wants to go ahead and use of the NovoLog first she will keep a track of, she is using so that we can convert to long-acting insulin over the next couple of months.

## 2014-09-17 ENCOUNTER — Ambulatory Visit: Payer: 59 | Admitting: Family Medicine

## 2014-09-21 ENCOUNTER — Telehealth: Payer: Self-pay | Admitting: *Deleted

## 2014-09-21 NOTE — Telephone Encounter (Signed)
Called and spoke with pt's wife regarding referral for rheumatology. I informed her that [facility]. Corliss Skainseveshwar office and she informed me that they had called him on 09-12-14 and told him that they needed additional information from his previous rheumatologist. She stated that she never received the call from their office. I looked back in his chart to see if there were any notes from his previous Rheumatology doctor and could not find anything. I told her that we will need to get a ROI from them and then fax this over to Dr. Fatima Sangereveshwar's ofc so that they can review. I faxed her a form to complete and sign so that she can start this process.Loralee PacasBarkley, Gwendy Boeder [place]

## 2014-10-09 ENCOUNTER — Encounter: Payer: Self-pay | Admitting: Family Medicine

## 2014-10-13 ENCOUNTER — Encounter: Payer: Self-pay | Admitting: Family Medicine

## 2014-10-15 ENCOUNTER — Other Ambulatory Visit: Payer: Self-pay | Admitting: Family Medicine

## 2014-10-15 MED ORDER — INSULIN ASPART 100 UNIT/ML FLEXPEN: PEN_INJECTOR | SUBCUTANEOUS | Status: DC

## 2014-10-15 MED ORDER — AMBULATORY NON FORMULARY MEDICATION: Status: DC

## 2014-10-16 ENCOUNTER — Other Ambulatory Visit: Payer: Self-pay | Admitting: *Deleted

## 2014-10-16 MED ORDER — INSULIN ASPART 100 UNIT/ML FLEXPEN: PEN_INJECTOR | SUBCUTANEOUS | Status: DC

## 2014-10-26 ENCOUNTER — Ambulatory Visit (INDEPENDENT_AMBULATORY_CARE_PROVIDER_SITE_OTHER): Payer: Medicare PPO | Admitting: Family Medicine

## 2014-10-26 ENCOUNTER — Encounter: Payer: Self-pay | Admitting: Family Medicine

## 2014-10-26 VITALS — BP 103/64 | HR 91 | Wt 145.0 lb

## 2014-10-26 DIAGNOSIS — E860 Dehydration: Secondary | ICD-10-CM

## 2014-10-26 DIAGNOSIS — I776 Arteritis, unspecified: Secondary | ICD-10-CM | POA: Diagnosis not present

## 2014-10-26 DIAGNOSIS — E1165 Type 2 diabetes mellitus with hyperglycemia: Secondary | ICD-10-CM

## 2014-10-26 DIAGNOSIS — IMO0002 Reserved for concepts with insufficient information to code with codable children: Secondary | ICD-10-CM

## 2014-10-26 DIAGNOSIS — R634 Abnormal weight loss: Secondary | ICD-10-CM

## 2014-10-26 MED ORDER — INSULIN GLARGINE 300 UNIT/ML ~~LOC~~ SOPN: 10.0000 [IU] | PEN_INJECTOR | Freq: Every day | SUBCUTANEOUS | Status: AC

## 2014-10-26 MED ORDER — METFORMIN HCL 500 MG PO TABS: 500.0000 mg | ORAL_TABLET | Freq: Two times a day (BID) | ORAL | Status: DC

## 2014-10-26 NOTE — Progress Notes (Signed)
Subjective:    Patient ID: Justin Day, male    DOB: [birth year], 74 y.o.   MRN: [id number]  HPI Says has lost 11 lbs  In the last month.  He is on 20 mg BID for the prednisone. No GI upset.  It makes him irritable and fidgety. He has been sleeping more than usual.  He is sleeping for about 12 hours a day.  Usually goes to bed around 11 PM and then sleeps until almost noon the next day. Has been really fatigue.  Sugars running in the 200-300s. He typically eats his first meal around 12:30 after he wakes up, drinks Boost in the afternoon, and then eats and evening meal around 6 PM.  Urine has been more concentrated than usual no blood or bad odor..  No fevers, chills. No abdominal pain or ST or URI. No swelling.  Feet have been swelling a little but this is not new. Schedule for his echo in 2 weeks. No CP or SOB.   Diabetes-his wife is very concerned about how frequently she has to check his sugars. Right now she's doing it before each meal and at bedtime which is 4 times a day. He gets very upset by this and says it's painful. She tries to rotate fingers. She is wondering at this point if it's more harm than good for him to continue the steroid switch drives his sugars up which results in him needing insulin and frequent fingersticks.  Aortitis-he's currently on prednisone 20 mg twice a day. He had some progression of the aneurysm over the last year while being on prednisone which was eventually tapered off in February but he restarted it recently. He does have a follow-up with rheumatology in about 2 weeks. His wife is concerned because she feels like it makes him very irritable and fidgety. She is at the point where she's really trying to decide whether not to continue it or not.  Review of Systems  BP 103/64 mmHg  Pulse 91  Wt 145 lb (65.772 kg)  SpO2 98%    Allergies  Allergen Reactions  . Peanut-Containing Drug Products Anaphylaxis    Past Medical History  Diagnosis Date  .  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 Temporal arteritis 09/29/13  . Pericardial effusion 07/20/13  . Gangrenous cholecystitis 07/02/13  . Shingles 2003  . Pericardial effusion   . Progressive supranuclear palsy   . Thoracic aortic aneurysm     Past Surgical History  Procedure Laterality Date  . Fasciectomy  08/2006    Hand Dupuytrens, left   . Nasal septoplasty w/ turbinoplasty  08/2002  . Knee surgery    . Pericardial window    . Tonsillectomy    . Cholecystectomy      History   Social History  . Marital Status: Married    Spouse Name: N/A  . Number of Children: N/A  . Years of Education: N/A   Occupational History  . retired    Social History Main Topics  . Smoking status: Never Smoker   . Smokeless tobacco: Not on file  . Alcohol Use: No  . Drug Use: No  . Sexual Activity: Not Currently   Other Topics Concern  . Not on file   Social History Narrative   Retired. History of one year of college. Married to [place]. 3 adult children. Recently moved from [place]. Some exercise on the bike for 20 minutes once daily.    Family History  Problem Relation Age of Onset  .  Breast cancer Mother   . Heart attack Father   . Diabetes Father   . Hyperlipidemia Father   . Hypertension Father     Outpatient Encounter Prescriptions as of 10/26/2014  Medication Sig  . AMBULATORY NON FORMULARY MEDICATION Test strips. Use as directed with insulin Three (3) times a day. Dx: DM type 2 controlled. Dx code:E11.9  . aspirin 81 MG tablet Take 81 mg by mouth daily.  . Cholecalciferol (D 1000 PO) Take by mouth.  . Cyanocobalamin (VITAMIN B12 PO) Take by mouth.  . fluocinonide (LIDEX) 0.05 % external solution Apply 1 application topically 2 (two) times daily.  . Insulin Pen Needle 31G X 6 MM MISC Use as directed with insulin Three (3) times a day.  Dx: DM type 2 controlled. Dx code:E11.9  . ipratropium (ATROVENT) 0.03 % nasal spray Place 2 sprays into both nostrils every 12 (twelve) hours.  . memantine  (NAMENDA XR) 14 MG CP24 24 hr capsule Take 14 mg by mouth.  . modafinil (PROVIGIL) 200 MG tablet Take 200 mg by mouth daily.  . predniSONE (DELTASONE) 20 MG tablet Two by mouth every day for treatment of temporal arteritis.  Marland Kitchen zolpidem (AMBIEN) 5 MG tablet TAKE ONE TABLET BY MOUTH AT BEDTIME AS NEEDED FOR SLEEP  . [DISCONTINUED] insulin aspart (NOVOLOG) 100 UNIT/ML FlexPen Use sliding scale for each meal. Max dose of 30 units.  . Insulin Glargine (TOUJEO SOLOSTAR) 300 UNIT/ML SOPN Inject 10 Units into the skin at bedtime.  . metFORMIN (GLUCOPHAGE) 500 MG tablet Take 1 tablet (500 mg total) by mouth 2 (two) times daily with a meal.   No facility-administered encounter medications on file as of 10/26/2014.          Objective:   Physical Exam  Constitutional: He is oriented to person, place, and time. He appears well-developed and well-nourished.  HENT:  Head: Normocephalic and atraumatic.  Cardiovascular: Normal rate, regular rhythm and normal heart sounds.   Pulmonary/Chest: Effort normal and breath sounds normal.  Musculoskeletal: He exhibits no edema.  Neurological: He is alert and oriented to person, place, and time.  Skin: Skin is warm and dry.  Psychiatric: He has a normal mood and affect. His behavior is normal.          Assessment & Plan:  Abnormal weight loss-suspect secondary to some mild dehydration. I think this is secondary to recent elevation in blood sugars over the last couple of weeks. He is able to pee take by mouth normally. He has not been nauseated or vomiting. I'm going to taper down his prednisone over the next 2 weeks. And then also put him on metformin. Monitor for any GI upset or diarrhea. We'll start long-acting insulin at bedtime. Stressed the importance of focusing on increasing hydration over the next few days since he is able to take by mouth without any difficulty.  Diabetes, secondary to steroid use-will switch to long-acting insulin. Start with Toujeo  5 units at bedtime. This should reduce how frequently his wife is having to check his sugar. Hopefully she can just check it when he first gets up before his first meal of the day and at bedtime. We'll also start metformin. Warned about potential for GI side effects. Call if any palms or side effects.  Aortitis-keep follow-up with rheumatology. Will going to decrease his prednisone to 30 mg this week and then down to 20 mg next week. I'm more worried about his blood sugars in the acute setting since it seems  like he is him is getting mildly dehydrated. Once we get his sugars under better control then if his rheumatologist wants to go back up on the prednisone he can. It sounds like there needs to be a discussion between the family and rheumatology about whether not to continue the prednisone at all.

## 2014-10-26 NOTE — Patient Instructions (Signed)
Decrease prednisone to 30mg  daily for one week, then one a day ( 20mg ).  Increase Toujeo 5 units at bedtime.  After one week can increase to 6 units if sugars running greater than 120 when wakes up.

## 2014-11-12 ENCOUNTER — Other Ambulatory Visit: Payer: Self-pay | Admitting: Family Medicine

## 2014-11-13 ENCOUNTER — Telehealth: Payer: Self-pay | Admitting: Family Medicine

## 2014-11-13 NOTE — Telephone Encounter (Signed)
Received fax from pharmacy for Zolpidem Tartrate 5 mg sent through cover my meds waiting on authorization. - CF

## 2014-11-14 NOTE — Telephone Encounter (Signed)
Received fax from [facility] and Zolpidem Tartrate 5mg  is approved until 11/13/2015. - CF

## 2014-11-15 LAB — CBC AND DIFFERENTIAL
Hemoglobin: 14.9 g/dL (ref 13.5–17.5)
Platelets: 302 10*3/uL (ref 150–399)
WBC: 16.9 10^3/mL

## 2014-11-15 LAB — HEPATIC FUNCTION PANEL
ALK PHOS: 112 U/L (ref 25–125)
ALT: 89 U/L — AB (ref 10–40)
AST: 35 U/L (ref 14–40)
Bilirubin, Total: 0.8 mg/dL

## 2014-11-15 LAB — BASIC METABOLIC PANEL
CREATININE: 0.9 mg/dL (ref 0.6–1.3)
GLUCOSE: 137 mg/dL
Potassium: 5.2 mmol/L (ref 3.4–5.3)
Sodium: 138 mmol/L (ref 137–147)

## 2014-11-15 LAB — COMPLETE METABOLIC PANEL WITH GFR: Chloride: 100 mmol/L

## 2014-11-15 LAB — POCT ERYTHROCYTE SEDIMENTATION RATE, NON-AUTOMATED: SED RATE: 15 mm

## 2014-11-16 ENCOUNTER — Other Ambulatory Visit (HOSPITAL_COMMUNITY): Payer: Medicare PPO

## 2014-11-16 LAB — SEDIMENTATION RATE: SED RATE: 15

## 2014-11-16 LAB — C REACTIVE PROTEIN, FLUID: CRP MG/L: 16.9

## 2014-11-16 LAB — RHEUMATOID FACTORS, FLUID: Rheumatoid Factor Ab (IgM): 9.2

## 2014-11-19 ENCOUNTER — Other Ambulatory Visit: Payer: Self-pay | Admitting: Rheumatology

## 2014-11-19 ENCOUNTER — Ambulatory Visit (HOSPITAL_COMMUNITY): Payer: Medicare PPO | Attending: Cardiology

## 2014-11-19 ENCOUNTER — Other Ambulatory Visit: Payer: Self-pay

## 2014-11-19 DIAGNOSIS — I712 Thoracic aortic aneurysm, without rupture, unspecified: Secondary | ICD-10-CM

## 2014-11-19 DIAGNOSIS — I351 Nonrheumatic aortic (valve) insufficiency: Secondary | ICD-10-CM | POA: Insufficient documentation

## 2014-11-19 DIAGNOSIS — I776 Arteritis, unspecified: Secondary | ICD-10-CM

## 2014-11-19 DIAGNOSIS — I313 Pericardial effusion (noninflammatory): Secondary | ICD-10-CM | POA: Diagnosis not present

## 2014-11-19 DIAGNOSIS — I071 Rheumatic tricuspid insufficiency: Secondary | ICD-10-CM | POA: Diagnosis not present

## 2014-11-19 LAB — ANA: Antinuclear Antibodies (ANA): NEGATIVE

## 2014-11-21 LAB — HEPATIC FUNCTION PANEL
ALT: 89 U/L — AB (ref 10–40)
AST: 35 U/L (ref 14–40)
Alkaline Phosphatase: 112 U/L (ref 25–125)

## 2014-11-21 LAB — BASIC METABOLIC PANEL
CREATININE: 0.9 mg/dL (ref 0.6–1.3)
GLUCOSE: 137 mg/dL
POTASSIUM: 5.2 mmol/L (ref 3.4–5.3)
POTASSIUM: 5.2 mmol/L (ref 3.4–5.3)
SODIUM: 138 mmol/L (ref 137–147)
Sodium: 138 mmol/L (ref 137–147)

## 2014-11-21 LAB — CBC AND DIFFERENTIAL
Hemoglobin: 14.9 g/dL (ref 13.5–17.5)
Platelets: 302 10*3/uL (ref 150–399)
WBC: 16.9 10*3/mL

## 2014-11-22 ENCOUNTER — Telehealth: Payer: Self-pay | Admitting: *Deleted

## 2014-11-22 ENCOUNTER — Ambulatory Visit
Admission: RE | Admit: 2014-11-22 | Discharge: 2014-11-22 | Disposition: A | Discharge status: Home / Self Care | Payer: Medicare PPO | Source: Ambulatory Visit | Attending: Rheumatology | Admitting: Rheumatology

## 2014-11-22 DIAGNOSIS — I776 Arteritis, unspecified: Secondary | ICD-10-CM

## 2014-11-22 MED ORDER — IOPAMIDOL (ISOVUE-370) INJECTION 76%
75.0000 mL | Freq: Once | INTRAVENOUS | Status: AC | PRN
  Administered 2014-11-22: 75 mL via INTRAVENOUS

## 2014-11-22 NOTE — Telephone Encounter (Signed)
Just continue current dose of insulin. If she holds it for 2 days it will be okay. His sugars will be just a little bit higher but it will be okay.

## 2014-11-22 NOTE — Telephone Encounter (Signed)
I'm assuming that it was the pt's wife that called but she stated that Justin Day had a CT with contrast today and was advised to stay off his metformin for 48hrs.  She wants to know what she needs to do in the meantime regarding his blood sugars.  Please advise.

## 2014-11-23 NOTE — Telephone Encounter (Signed)
lvm informing pt's wife of recommendations.Justin Day [name]  

## 2014-11-27 ENCOUNTER — Encounter: Payer: Self-pay | Admitting: Family Medicine

## 2014-11-28 ENCOUNTER — Encounter: Payer: Self-pay | Admitting: Family Medicine

## 2014-12-11 ENCOUNTER — Other Ambulatory Visit: Payer: Self-pay | Admitting: Family Medicine

## 2014-12-11 ENCOUNTER — Telehealth: Payer: Self-pay | Admitting: *Deleted

## 2014-12-11 NOTE — Telephone Encounter (Signed)
Needs appt to evaluate better.

## 2014-12-11 NOTE — Telephone Encounter (Signed)
Pt's wife called and stated that he has a ulcer at the crack of his behind and she has been trying to treat this and it is not getting any better. She would like something to be called into his pharmacy for this. Will fwd to pcp for advice.Loralee Pacas [place]

## 2014-12-12 ENCOUNTER — Encounter: Payer: Self-pay | Admitting: Family Medicine

## 2014-12-12 ENCOUNTER — Ambulatory Visit (INDEPENDENT_AMBULATORY_CARE_PROVIDER_SITE_OTHER): Payer: Medicare PPO | Admitting: Family Medicine

## 2014-12-12 VITALS — BP 107/66 | HR 87 | Temp 97.7°F | Wt 143.0 lb

## 2014-12-12 DIAGNOSIS — S20419A Abrasion of unspecified back wall of thorax, initial encounter: Secondary | ICD-10-CM

## 2014-12-12 DIAGNOSIS — S31809A Unspecified open wound of unspecified buttock, initial encounter: Secondary | ICD-10-CM | POA: Diagnosis not present

## 2014-12-12 DIAGNOSIS — B351 Tinea unguium: Secondary | ICD-10-CM

## 2014-12-12 DIAGNOSIS — R296 Repeated falls: Secondary | ICD-10-CM

## 2014-12-12 MED ORDER — MUPIROCIN 2 % EX OINT: TOPICAL_OINTMENT | CUTANEOUS | Status: DC

## 2014-12-12 MED ORDER — CICLOPIROX 8 % EX SOLN: Freq: Every day | CUTANEOUS | Status: DC

## 2014-12-12 MED ORDER — AMBULATORY NON FORMULARY MEDICATION: Status: AC

## 2014-12-12 NOTE — Progress Notes (Signed)
   Subjective:    Patient ID: Justin Day, male    DOB: 1941/02/23, 74 y.o.   MRN: [id number]  HPI Has fallen several times in the last couple of weeks.  He feel this am while his wife was in the shower.  Would like a rx for a bed alarma nd chair alarm. He occ tries ot use his walker.  She says it's getting more difficult for her to pick him up and move him around.  Crack on buttocks crease started 5 weeks ago. She says it started initially after he had been wiping excessively. His wife was using triple A ointment and helped some.  Just about healed and then started to crack again.    Would like refill on Penlac for toenail fungus.   Review of Systems     Objective:   Physical Exam  Constitutional: He is oriented to person, place, and time. He appears well-developed and well-nourished.  HENT:  Head: Normocephalic and atraumatic.  Musculoskeletal:       Back:  Neurological: He is oriented to person, place, and time.  Skin: Skin is warm and dry.  Psychiatric: He has a normal mood and affect. His behavior is normal.          Assessment & Plan:  Frequent falls - I do think about alarms internal arms might be helpful. I will write up her prescription in for flu she can get it covered under her durable medical equipment. Unfortunately she is there with him 24 7 but she cannot necessarily keep an eye on him 24/7  Abrasion -  routine when care. Call if not healing well.  Open wound on  buttocks crack-we'll try mupirocin ointment. If not healing well over the next 2 weeks and please let me know.  Onychomycosis-we'll refill Penlac.

## 2014-12-12 NOTE — Telephone Encounter (Signed)
Called her back and advised her that he will need to be evaluated. appt made for today.Loralee Pacas [place]

## 2014-12-13 ENCOUNTER — Other Ambulatory Visit: Payer: Self-pay | Admitting: *Deleted

## 2014-12-13 MED ORDER — ZOLPIDEM TARTRATE 5 MG PO TABS: 5.0000 mg | ORAL_TABLET | Freq: Every evening | ORAL | Status: DC | PRN

## 2014-12-18 ENCOUNTER — Ambulatory Visit: Payer: Medicare PPO | Admitting: Sports Medicine

## 2014-12-18 ENCOUNTER — Ambulatory Visit: Payer: Medicare PPO | Admitting: Family Medicine

## 2014-12-31 LAB — HEPATIC FUNCTION PANEL
ALK PHOS: 84 U/L (ref 25–125)
ALT: 26 U/L (ref 10–40)
AST: 14 U/L (ref 14–40)

## 2014-12-31 LAB — CBC AND DIFFERENTIAL
HEMOGLOBIN: 14.3 g/dL (ref 13.5–17.5)
NEUTROS ABS: 17 /uL
Platelets: 336 10*3/uL (ref 150–399)

## 2014-12-31 LAB — BASIC METABOLIC PANEL
CREATININE: 0.7 mg/dL (ref 0.6–1.3)
Potassium: 5 mmol/L (ref 3.4–5.3)
Sodium: 140 mmol/L (ref 137–147)

## 2014-12-31 LAB — HEPATITIS B VIRUS (PROFILE VI)
Hep B Surface Ab, Qual: NONREACTIVE
Hepatitis Be Antigen: NEGATIVE

## 2014-12-31 LAB — C-REACTIVE PROTEIN: CRP MG/L: 10.8

## 2014-12-31 LAB — POCT ERYTHROCYTE SEDIMENTATION RATE, NON-AUTOMATED: SED RATE: 2 mm

## 2015-01-02 ENCOUNTER — Other Ambulatory Visit: Payer: Self-pay | Admitting: Family Medicine

## 2015-01-04 ENCOUNTER — Other Ambulatory Visit: Payer: Self-pay | Admitting: Family Medicine

## 2015-01-04 ENCOUNTER — Telehealth: Payer: Self-pay | Admitting: Family Medicine

## 2015-01-04 ENCOUNTER — Ambulatory Visit (INDEPENDENT_AMBULATORY_CARE_PROVIDER_SITE_OTHER): Payer: Medicare PPO

## 2015-01-04 DIAGNOSIS — R059 Cough, unspecified: Secondary | ICD-10-CM

## 2015-01-04 DIAGNOSIS — R05 Cough: Secondary | ICD-10-CM | POA: Diagnosis not present

## 2015-01-04 DIAGNOSIS — R82998 Other abnormal findings in urine: Secondary | ICD-10-CM

## 2015-01-04 NOTE — Telephone Encounter (Signed)
Patient and his wife walked-in adv that Dr. Nickola Major office [facility]  He was seen by Elpidio Anis physician assistant and they are requesting to know if Dr. Linford Arnold can order a chest x-ray and a UA w/ Micro to rile infections before they begin treating him for Arthritis. Marlowe Alt placed the orders in chart req to [facility] office request to have report/results  Faxed to [phone] Attn: Morrie Sheldon. Thanks

## 2015-01-05 LAB — URINALYSIS, ROUTINE W REFLEX MICROSCOPIC
Bilirubin Urine: NEGATIVE
GLUCOSE, UA: NEGATIVE
Hgb urine dipstick: NEGATIVE
Ketones, ur: NEGATIVE
LEUKOCYTES UA: NEGATIVE
NITRITE: NEGATIVE
PH: 6 (ref 5.0–8.0)
Protein, ur: NEGATIVE
SPECIFIC GRAVITY, URINE: 1.008 (ref 1.001–1.035)

## 2015-01-06 LAB — URINE CULTURE
Colony Count: NO GROWTH
Organism ID, Bacteria: NO GROWTH

## 2015-01-10 ENCOUNTER — Other Ambulatory Visit: Payer: Self-pay | Admitting: Family Medicine

## 2015-01-16 ENCOUNTER — Telehealth: Payer: Self-pay | Admitting: *Deleted

## 2015-01-16 NOTE — Telephone Encounter (Signed)
Pt's wife called asking that his records be obtained from [facility]. Discharge summary copied and pasted to word format and printed.Loralee Pacas [place]

## 2015-01-18 ENCOUNTER — Encounter: Payer: Self-pay | Admitting: Family Medicine

## 2015-01-18 ENCOUNTER — Ambulatory Visit (INDEPENDENT_AMBULATORY_CARE_PROVIDER_SITE_OTHER): Payer: Medicare PPO | Admitting: Family Medicine

## 2015-01-18 VITALS — BP 112/64 | HR 81 | Wt 141.0 lb

## 2015-01-18 DIAGNOSIS — G231 Progressive supranuclear ophthalmoplegia [Steele-Richardson-Olszewski]: Secondary | ICD-10-CM

## 2015-01-18 DIAGNOSIS — E119 Type 2 diabetes mellitus without complications: Secondary | ICD-10-CM

## 2015-01-18 DIAGNOSIS — G608 Other hereditary and idiopathic neuropathies: Secondary | ICD-10-CM | POA: Diagnosis not present

## 2015-01-18 DIAGNOSIS — L89151 Pressure ulcer of sacral region, stage 1: Secondary | ICD-10-CM

## 2015-01-18 DIAGNOSIS — I776 Arteritis, unspecified: Secondary | ICD-10-CM

## 2015-01-18 DIAGNOSIS — Z23 Encounter for immunization: Secondary | ICD-10-CM | POA: Diagnosis not present

## 2015-01-18 DIAGNOSIS — S31801A Laceration without foreign body of unspecified buttock, initial encounter: Secondary | ICD-10-CM

## 2015-01-18 LAB — POCT GLYCOSYLATED HEMOGLOBIN (HGB A1C): Hemoglobin A1C: 6.2

## 2015-01-18 NOTE — Progress Notes (Signed)
   Subjective:    Patient ID: Justin Day, male    DOB: [birth year], 74 y.o.   MRN: [id number]  HPI   He was hospitalized Monday. 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 Head CT was neg. No stroke. W/U was negatvive and he was better by the end of the day and d/c'd home.   Diabetes - no hypoglycemic events. No wounds or sores that are not healing well. No increased thirst or urination. Checking glucose at home. Taking medications as prescribed without any side effects. He will hopefully be getting off the prednisone.    Aortitis-He will be hopefully taken off the prednisone.  They're going to try him on any medication and will require infusion once a week for the next 4 weeks. The prednisone has been the main trigger for his diabetes so if he is able to come off of this we will hopefully be able to wean his insulin and maybe even the metformin.  Pressure ulcer on buttock crease - his wife says that it is really more of a crack in the buttock crease area. It was there before and she been putting some creams and ointments on it and was finally getting it to heal. Unfortunately.  Sitting on donut cushion is ore uncomfortable.    Dementia - needs refill on the Namenda but unfortunately he has had his Medicare gap.   Review of Systems     Objective:   Physical Exam  Musculoskeletal:       Legs:         Assessment & Plan:  DM- a1c is down to 6.2.  Fantastic. Continue current regimen. No recent hypoglycemic events. If he does well then we may be able to decrease the Lantus if he is able to get off the prednisone.  Dementia/SNP- we will try to call the resident and see if we might be able to get some samples of Namenda XR. Also encouraged his wife to check into financial aid through the company directly since he is now considered under insured with his Medicare.  Pressure ulcer/skin tear.  Will treat with DuoDERM. Applied here today in the office. Coupon for 3 days  and then removed gently. Instructed his wife to then wash the scan with gentle soap and water at dry and then reapply. I would like to see him back in one week to make sure that it's healing well.  Aortitis- will start a new medication soon so hopefullly will be able to come off of prednisone.    Tdap given 09/14/14.

## 2015-01-25 ENCOUNTER — Ambulatory Visit (INDEPENDENT_AMBULATORY_CARE_PROVIDER_SITE_OTHER): Payer: Medicare PPO | Admitting: Family Medicine

## 2015-01-25 ENCOUNTER — Encounter: Payer: Self-pay | Admitting: Family Medicine

## 2015-01-25 VITALS — BP 117/66 | HR 79 | Wt 141.0 lb

## 2015-01-25 DIAGNOSIS — R21 Rash and other nonspecific skin eruption: Secondary | ICD-10-CM | POA: Diagnosis not present

## 2015-01-25 DIAGNOSIS — L8990 Pressure ulcer of unspecified site, unspecified stage: Secondary | ICD-10-CM

## 2015-01-25 MED ORDER — FLUCONAZOLE 150 MG PO TABS: 150.0000 mg | ORAL_TABLET | ORAL | Status: DC

## 2015-01-25 NOTE — Patient Instructions (Signed)
Take Dilfucan 1 tab every 3 days for total of 3 tabs.

## 2015-01-25 NOTE — Progress Notes (Signed)
   Subjective:    Patient ID: Justin Day, male    DOB: 1940-05-07, 74 y.o.   MRN: [id number]  HPI F/u decubitus ulcer -  His wife says she was using the Duoderm but it seemed to be injuring the skin when she removed it so stopped using that and started using the cream and felt like it looked better last night.  Now today there is an debrided area again. No active drainage but she has notices persistant raise red bumps around the area.  No fevers chills. He is not very ambulatory systems a lot of time during the day sitting. He is using a walker with some assistance today.   Review of Systems     Objective:   Physical Exam  Constitutional: He appears well-developed and well-nourished.  HENT:  Head: Normocephalic and atraumatic.  Eyes: Conjunctivae are normal.  Skin:  In the buttock crease there is an approx 1 cm area where the skin has broken down, stage 2.  Unable to see fat or tendon.  No active drainage.  The surrounding tissue has a white appearance and along the borderline pink raised papules.  No vesicle or pustules.            Assessment & Plan:  Decubitus ulcer - will tx with xeroform with gauze placed on top.  Change daily. If healing well then no f/u needed.  Continue to use the special cushion.   Rash - possible fungal infection. Will tx with oral diflucan.

## 2015-01-28 ENCOUNTER — Encounter: Payer: Self-pay | Admitting: Family Medicine

## 2015-01-31 ENCOUNTER — Encounter: Payer: Self-pay | Admitting: Family Medicine

## 2015-02-04 ENCOUNTER — Ambulatory Visit (INDEPENDENT_AMBULATORY_CARE_PROVIDER_SITE_OTHER): Payer: Medicare PPO | Admitting: Family Medicine

## 2015-02-04 ENCOUNTER — Encounter: Payer: Self-pay | Admitting: Family Medicine

## 2015-02-04 VITALS — BP 123/76 | HR 88 | Wt 141.0 lb

## 2015-02-04 DIAGNOSIS — T169XXA Foreign body in ear, unspecified ear, initial encounter: Secondary | ICD-10-CM | POA: Insufficient documentation

## 2015-02-04 DIAGNOSIS — T161XXA Foreign body in right ear, initial encounter: Secondary | ICD-10-CM

## 2015-02-04 DIAGNOSIS — L89152 Pressure ulcer of sacral region, stage 2: Secondary | ICD-10-CM | POA: Insufficient documentation

## 2015-02-04 NOTE — Progress Notes (Signed)
Justin Day is a 74 y.o. male who presents to [facility]  today for foreign body and right ear and follow-up pressure ulcer.  1) right ear foreign body. Present for a few days. The patient and his wife are concerned that he has a hearing aid ear but stuck in his right ear. He has discomfort and pain. No fevers or chills.  2) pressure sore. Patient has severe medical issues including a neurologic condition somewhat of Parkinson's disease. He spends a lot of time sitting down. He's had several evaluations recently for a sacral decubitus ulcer stage II. He's had several treatments including different topical treatments and pressure decreasing devices. His wife states that his skin is a little bit better.   Past Medical History  Diagnosis Date  . Aortitis (HCC) 07/20/13  . Temporal arteritis (HCC) 09/29/13  . Pericardial effusion 07/20/13  . Gangrenous cholecystitis 07/02/13  . Shingles 2003  . Pericardial effusion   . Progressive supranuclear palsy (HCC)   . Thoracic aortic aneurysm [facility])    Past Surgical History  Procedure Laterality Date  . Fasciectomy  08/2006    Hand Dupuytrens, left   . Nasal septoplasty w/ turbinoplasty  08/2002  . Knee surgery    . Pericardial window    . Tonsillectomy    . Cholecystectomy     Social History  Substance Use Topics  . Smoking status: Never Smoker   . Smokeless tobacco: Not on file  . Alcohol Use: No   family history includes Breast cancer in his mother; Diabetes in his father; Heart attack in his father; Hyperlipidemia in his father; Hypertension in his father.  ROS as above Medications: Current Outpatient Prescriptions  Medication Sig Dispense Refill  . AMBULATORY NON FORMULARY MEDICATION Test strips. Use as directed with insulin Three (3) times a day. Dx: DM type 2 controlled. Dx code:E11.9 100 each 11  . AMBULATORY NON FORMULARY MEDICATION Medication Name: Bed and Chair alarm. Dx: supranuclear palsy. 1  Units PRN  . aspirin 81 MG tablet Take 81 mg by mouth daily.    . Cholecalciferol (D 1000 PO) Take by mouth.    . ciclopirox (PENLAC) 8 % solution Apply topically at bedtime. Apply over nail and surrounding skin. Apply daily over previous coat. After seven (7) days, may remove with alcohol and continue cycle. 6.6 mL 0  . Cyanocobalamin (VITAMIN B12 PO) Take by mouth.    . fluconazole (DIFLUCAN) 150 MG tablet Take 1 tablet (150 mg total) by mouth every 3 (three) days. 3 tablet 0  . Insulin Glargine (TOUJEO SOLOSTAR) 300 UNIT/ML SOPN Inject 10 Units into the skin at bedtime. 1.5 mL 3  . Insulin Pen Needle 31G X 6 MM MISC Use as directed with insulin Three (3) times a day.  Dx: DM type 2 controlled. Dx code:E11.9 100 each 11  . memantine (NAMENDA XR) 14 MG CP24 24 hr capsule Take 14 mg by mouth.    . metFORMIN (GLUCOPHAGE) 500 MG tablet Take 1 tablet (500 mg total) by mouth 2 (two) times daily with a meal. 60 tablet 3  . modafinil (PROVIGIL) 200 MG tablet Take 200 mg by mouth daily.    . mupirocin ointment (BACTROBAN) 2 % APPLY TO INSIDE OF EACH NARES DAILY FOR 10 DAYS THEN TWICE A WEEK FOR MAINTENANCE. 22 g PRN  . predniSONE (DELTASONE) 20 MG tablet Two by mouth every day for treatment of temporal arteritis. 60 tablet 1  . zolpidem (AMBIEN) 5 MG  tablet TAKE ONE TABLET BY MOUTH AT BEDTIME AS NEEDED FOR SLEEP 30 tablet 0   No current facility-administered medications for this visit.   Allergies  Allergen Reactions  . Peanut-Containing Drug Products Anaphylaxis  . Carbidopa-Levodopa Other (See Comments)    pericarditis     Exam:  BP 123/76 mmHg  Pulse 88  Wt 141 lb (63.957 kg) Gen: Well NAD HEENT: EOMI,  MMM right ear canal with visible translucent plastic. Left is normal Lungs: Normal work of breathing. CTABL Heart: RRR no MRG Abd: NABS, Soft. Nondistended, Nontender Exts: Brisk capillary refill, warm and well perfused.  Skin: Small stage II sacral decubitus ulcer without any  surrounding tissue breakdown or necrosis. No fat or connective tissue visible. Nontender.  The plastic was removed with forceps. The ear canal had a small abrasion following removal and patient felt much better  No results found for this or any previous visit (from the past 24 hour(s)). No results found.   Please see individual assessment and plan sections.

## 2015-02-04 NOTE — Assessment & Plan Note (Signed)
Removed. Patient felt much better. Follow-up with PCP

## 2015-02-04 NOTE — Assessment & Plan Note (Signed)
Doing well. Continue pressure reduction treatments. Return in one week for recheck and reevaluation with myself or PCP.

## 2015-02-04 NOTE — Patient Instructions (Signed)
Thank you for coming in today. Continue the pressure wound treatment.  Let me know if you want some ear drops.  Follow up with Dr. Linford Arnold or myself in 1 week.

## 2015-02-07 ENCOUNTER — Other Ambulatory Visit: Payer: Self-pay | Admitting: Family Medicine

## 2015-02-11 ENCOUNTER — Encounter: Payer: Self-pay | Admitting: Family Medicine

## 2015-02-11 ENCOUNTER — Ambulatory Visit (INDEPENDENT_AMBULATORY_CARE_PROVIDER_SITE_OTHER): Payer: Medicare PPO | Admitting: Family Medicine

## 2015-02-11 VITALS — BP 106/63 | HR 95 | Temp 97.5°F | Resp 16 | Wt 138.8 lb

## 2015-02-11 DIAGNOSIS — G608 Other hereditary and idiopathic neuropathies: Secondary | ICD-10-CM

## 2015-02-11 DIAGNOSIS — L89152 Pressure ulcer of sacral region, stage 2: Secondary | ICD-10-CM | POA: Diagnosis not present

## 2015-02-11 DIAGNOSIS — G231 Progressive supranuclear ophthalmoplegia [Steele-Richardson-Olszewski]: Secondary | ICD-10-CM

## 2015-02-11 NOTE — Progress Notes (Signed)
   Subjective:    Patient ID: Justin Day, male    DOB: May 01, 1941, 74 y.o.   MRN: [id number]  HPI  follow-up decubitus ulcer of  sacral region, stage II- really hasn't improved and healing. He does sit on a doughnut type cushion sometimes but also spends a lot of time in a recliner. He's not very ambulatory and needs assistance. He does use  Technical brewer.  The area was bleeding a couple of night ago. They have been using xeroform on it and we have aleady tried Duoderm.   SNP- he is wife received a letter saying that they will not cover the Namenda and less we request authorization. He has tried Aricept in the past but it caused dizziness.  Review of Systems     Objective:   Physical Exam  Constitutional: He appears well-developed and well-nourished.  HENT:  Head: Normocephalic and atraumatic.  Neurological: He is alert.  Skin: Skin is warm.  In the buttocks crease just above the rectum there is a pink inflamed area with a small break in the skin at the crease.  Psychiatric: He has a normal mood and affect. His behavior is normal.          Assessment & Plan:  Sacral ulcer sacral region, stage II -  Since we have really not had a lot of improvement with wound healing at like to put an order for home health to come out and do wound care. Also discussed we really need to get him out of the recliner is much as possible during the day as this putting a lot of pressure on the tailbone area. It might want to even look into the U-shaped foam cushions as well.  SNP- we'll try to get authorization for the Aricept.

## 2015-02-19 ENCOUNTER — Telehealth: Payer: Self-pay | Admitting: *Deleted

## 2015-02-19 NOTE — Telephone Encounter (Signed)
Priscilla w/[facility] called and stated that they were supposed to go out to the pt's home and they have not been able to contact them by home phone or cell phone. She wanted us to be aware of this. Laureen Ochs.Gaynor Ferreras, Viann Shoveonya [name]

## 2015-02-20 ENCOUNTER — Other Ambulatory Visit: Payer: Self-pay | Admitting: Family Medicine

## 2015-02-21 ENCOUNTER — Encounter: Payer: Self-pay | Admitting: *Deleted

## 2015-02-21 NOTE — Progress Notes (Signed)
Patient ID: Justin ReekDavid Day, male   DOB: March 22, 1941, 74 y.o.   MRN: [id number] On 02/18/15, Mrs. Justin Day called our office to cancel the upcoming CTA CHEST and office visit scheduled with Dr. Donata ClayVan Trigt for her husband, Justin HuaDavid.  They feel there is no need for any further visits with him regarding his aortic disease since he is not a surgical candidate. Another doctor is keeping track and a CTA was done 11/22/14.I said I would forward thi message to Dr. Donata ClayVan Trigt which I did.  He requested it was documented.

## 2015-02-26 ENCOUNTER — Telehealth: Payer: Self-pay | Admitting: *Deleted

## 2015-02-26 NOTE — Telephone Encounter (Signed)
Called pt's insurance company to inquire about coverage for bed and chair alarm. Was transferred to the [facility] Dept after giving the pt's name, dob, id, pcp name and npi #.

## 2015-02-27 ENCOUNTER — Telehealth: Payer: Self-pay

## 2015-02-27 ENCOUNTER — Ambulatory Visit: Payer: Medicare PPO | Admitting: Cardiothoracic Surgery

## 2015-02-27 NOTE — Telephone Encounter (Signed)
Speech therapist called asking for a verbal request to perform therapy on pt twice a week for 2 weeks. Speech therapy ok'd by Dr. Linford ArnoldMetheney.

## 2015-03-01 ENCOUNTER — Telehealth: Payer: Self-pay | Admitting: *Deleted

## 2015-03-01 NOTE — Telephone Encounter (Signed)
June with [facility] called requesting VO for continued PT for 2x a week for the next 5 weeks.Loralee PacasBarkley, Laurine Kuyper DriftwoodLynetta

## 2015-03-04 ENCOUNTER — Other Ambulatory Visit: Payer: Self-pay | Admitting: Family Medicine

## 2015-03-04 MED ORDER — GLUCOSE BLOOD VI STRP: ORAL_STRIP | Status: AC

## 2015-03-04 MED ORDER — TRUE METRIX AIR GLUCOSE METER W/DEVICE KIT: 1.0000 | PACK | Freq: Three times a day (TID) | Status: AC

## 2015-03-04 NOTE — Telephone Encounter (Signed)
Old glucometer has been discontinued, requesting Rx for new True Metrix Meter and strips. Will send over.

## 2015-03-10 ENCOUNTER — Other Ambulatory Visit: Payer: Self-pay | Admitting: Family Medicine

## 2015-03-13 ENCOUNTER — Other Ambulatory Visit: Payer: Self-pay | Admitting: Rheumatology

## 2015-03-13 DIAGNOSIS — M3130 Wegener's granulomatosis without renal involvement: Secondary | ICD-10-CM

## 2015-03-15 ENCOUNTER — Telehealth: Payer: Self-pay | Admitting: Family Medicine

## 2015-03-15 NOTE — Telephone Encounter (Signed)
Dr Linford ArnoldMetheney, Laureen AbrahamsShakia McKenzie Speech Therapist with [facility] called. She wants verbal orders to continue speech therapy with the patient. Thank you.

## 2015-03-15 NOTE — Telephone Encounter (Signed)
Verbal order given to okay extension of therapy.

## 2015-03-20 ENCOUNTER — Telehealth: Payer: Self-pay | Admitting: Family Medicine

## 2015-03-20 NOTE — Telephone Encounter (Signed)
Justin Day, Justin Day OT for Mr. Justin Day left vm. She wants you to call her regarding wife requesting no more occupational therapy  for [name].  Thank you.

## 2015-03-21 ENCOUNTER — Ambulatory Visit
Admission: RE | Admit: 2015-03-21 | Discharge: 2015-03-21 | Disposition: A | Discharge status: Home / Self Care | Payer: Medicare PPO | Source: Ambulatory Visit | Attending: Rheumatology | Admitting: Rheumatology

## 2015-03-21 DIAGNOSIS — M3130 Wegener's granulomatosis without renal involvement: Secondary | ICD-10-CM

## 2015-03-21 MED ORDER — IOPAMIDOL (ISOVUE-370) INJECTION 76%
75.0000 mL | Freq: Once | INTRAVENOUS | Status: AC | PRN
  Administered 2015-03-21: 75 mL via INTRAVENOUS

## 2015-03-26 ENCOUNTER — Other Ambulatory Visit: Payer: Self-pay

## 2015-03-26 MED ORDER — METFORMIN HCL 500 MG PO TABS: 500.0000 mg | ORAL_TABLET | Freq: Two times a day (BID) | ORAL | Status: DC

## 2015-03-27 ENCOUNTER — Other Ambulatory Visit: Payer: Self-pay | Admitting: Physician Assistant

## 2015-03-27 DIAGNOSIS — M3130 Wegener's granulomatosis without renal involvement: Secondary | ICD-10-CM

## 2015-04-15 ENCOUNTER — Ambulatory Visit (INDEPENDENT_AMBULATORY_CARE_PROVIDER_SITE_OTHER): Payer: Medicare PPO

## 2015-04-15 ENCOUNTER — Encounter: Payer: Self-pay | Admitting: Osteopathic Medicine

## 2015-04-15 ENCOUNTER — Ambulatory Visit (INDEPENDENT_AMBULATORY_CARE_PROVIDER_SITE_OTHER): Payer: Medicare PPO | Admitting: Osteopathic Medicine

## 2015-04-15 VITALS — BP 128/62 | HR 96 | Temp 97.7°F | Wt 139.0 lb

## 2015-04-15 DIAGNOSIS — R059 Cough, unspecified: Secondary | ICD-10-CM

## 2015-04-15 DIAGNOSIS — R05 Cough: Secondary | ICD-10-CM

## 2015-04-15 NOTE — Progress Notes (Signed)
HPI: Justin Day is a 74 y.o. male who presents to [facility] today for chief complaint of:  Chief Complaint  Patient presents with  . Cough     . Severity: mild . Duration: 3 days ago . Timing: intermittent . Context: initially clearing lungs well but wife concerned for crackling noises, concerned for aspiration pneumonia . Modifying factors: no abx past 3 months  . Assoc signs/symptoms: tired lately, normal temp 97.4 but 98.6 lately   Wife assists with history  Past medical, social and family history reviewed: Past Medical History  Diagnosis Date  . 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 Temporal arteritis ([place]) 09/29/13  . Pericardial effusion 07/20/13  . Gangrenous cholecystitis 07/02/13  . Shingles 2003  . Pericardial effusion   . 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 Thoracic aortic aneurysm [facility])    Past Surgical History  Procedure Laterality Date  . Fasciectomy  08/2006    Hand Dupuytrens, left   . Nasal septoplasty w/ turbinoplasty  08/2002  . Knee surgery    . Pericardial window    . Tonsillectomy    . Cholecystectomy     Social History  Substance Use Topics  . Smoking status: Never Smoker   . Smokeless tobacco: Not on file  . Alcohol Use: No   Family History  Problem Relation Age of Onset  . Breast cancer Mother   . Heart attack Father   . Diabetes Father   . Hyperlipidemia Father   . Hypertension Father     Current Outpatient Prescriptions  Medication Sig Dispense Refill  . AMBULATORY NON FORMULARY MEDICATION Medication Name: Bed and Chair alarm. Dx: supranuclear palsy. 1 Units PRN  . aspirin 81 MG tablet Take 81 mg by mouth daily.    . Blood Glucose Monitoring Suppl (TRUE METRIX AIR GLUCOSE METER) W/DEVICE KIT 1 kit by Does not apply route 3 (three) times daily. Use as directed with insulin Three (3) times a day. Dx: DM type 2 controlled. Dx code:E11.9 1 kit 0  . Cholecalciferol (D 1000 PO) Take by mouth.    . ciclopirox  (PENLAC) 8 % solution Apply topically at bedtime. Apply over nail and surrounding skin. Apply daily over previous coat. After seven (7) days, may remove with alcohol and continue cycle. 6.6 mL 0  . Cyanocobalamin (VITAMIN B12 PO) Take by mouth.    Marland Kitchen glucose blood (TRUE METRIX BLOOD GLUCOSE TEST) test strip Use as directed with insulin Three (3) times a day. Dx: DM type 2 controlled. Dx code:E11.9 100 each 12  . Insulin Glargine (TOUJEO SOLOSTAR) 300 UNIT/ML SOPN Inject 10 Units into the skin at bedtime. 1.5 mL 3  . Insulin Pen Needle 31G X 6 MM MISC Use as directed with insulin Three (3) times a day.  Dx: DM type 2 controlled. Dx code:E11.9 100 each 11  . memantine (NAMENDA XR) 14 MG CP24 24 hr capsule Take 14 mg by mouth.    . metFORMIN (GLUCOPHAGE) 500 MG tablet Take 1 tablet (500 mg total) by mouth 2 (two) times daily with a meal. 60 tablet 0  . modafinil (PROVIGIL) 200 MG tablet Take 200 mg by mouth daily.    . mupirocin ointment (BACTROBAN) 2 % APPLY TO INSIDE OF EACH NARES DAILY FOR 10 DAYS THEN TWICE A WEEK FOR MAINTENANCE. 22 g PRN  . predniSONE (DELTASONE) 20 MG tablet Two by mouth every day for treatment of temporal arteritis. 60 tablet 1  . riTUXimab in sodium chloride  0.9 % 250 mL Inject into the vein.    Marland Kitchen zolpidem (AMBIEN) 5 MG tablet TAKE ONE TABLET BY MOUTH AT BEDTIME AS NEEDED FOR SLEEP 30 tablet 0   No current facility-administered medications for this visit.   Allergies  Allergen Reactions  . Peanut-Containing Drug Products Anaphylaxis  . Aricept [Donepezil Hcl] Other (See Comments)    Dizziness.   . Carbidopa-Levodopa Other (See Comments)    pericarditis      Review of Systems: ROS limited, pt minimally verbal CONSTITUTIONAL:  No  fever, no chills, No  unintentional weight changes HEAD/EYES/EARS/NOSE/THROAT: No  Headache, wife noticed some difficulty swallowing 5 days ago CARDIAC: No  chest pain RESPIRATORY: Yes  cough, No  shortness of  breath/wheeze GASTROINTESTINAL: No blood in stool, No  diarrhea, No  constipation  SKIN: No  rash/wounds/concerning lesions HEM/ONC: No  easy bruising/bleeding, No  abnormal lymph node NEUROLOGIC: No  weakness, No  dizziness    Exam:  BP 128/62 mmHg  Pulse 96  Temp(Src) 97.7 F (36.5 C) (Oral)  Wt 139 lb (63.05 kg) Constitutional: VS see above. General Appearance: alert, well-developed, well-nourished, NAD Eyes: Normal lids and conjunctive, non-icteric sclera, PERRLA Ears, Nose, Mouth, Throat: MMM, Normal external inspection ears/nares/mouth/lips/gums,  posterior pharynx No  erythema No  exudate Neck: No masses, trachea midline. No thyroid enlargement/tenderness/mass appreciated. No lymphadenopathy Respiratory: Normal respiratory effort. no wheeze, no rhonchi, no rales, minimal increase in inspiratory effort limits exam Cardiovascular: S1/S2 normal, no murmur, no rub/gallop auscultated. RRR.  Skin: warm, dry, intact.    No results found for this or any previous visit (from the past 72 hour(s)).  CXR on personal review demonstrates no concerning mass or infiltrate, lung bases are clear. Etiology interpretation agrees with personal review. Patient's wife was called to report radiology results.   ASSESSMENT/PLAN: Based on x-ray, no concern for pneumonia/aspiration at this time, his voice is advised to monitor closely, if any fever or difficulty breathing please return to clinic or to the emergency room for further evaluation, would hold off on antibiotics unless absolutely necessary and chest x-ray and exam did not indicate antibiotics for pneumonia diagnosis this time.   Cough - Plan: DG Chest 2 View   Return if symptoms worsen or fail to improve.

## 2015-04-22 ENCOUNTER — Other Ambulatory Visit: Payer: Self-pay

## 2015-04-22 ENCOUNTER — Encounter: Payer: Self-pay | Admitting: Family Medicine

## 2015-04-22 ENCOUNTER — Ambulatory Visit (INDEPENDENT_AMBULATORY_CARE_PROVIDER_SITE_OTHER): Payer: Medicare PPO | Admitting: Family Medicine

## 2015-04-22 VITALS — BP 128/72 | HR 84 | Wt 138.0 lb

## 2015-04-22 DIAGNOSIS — G608 Other hereditary and idiopathic neuropathies: Secondary | ICD-10-CM | POA: Diagnosis not present

## 2015-04-22 DIAGNOSIS — G231 Progressive supranuclear ophthalmoplegia [Steele-Richardson-Olszewski]: Secondary | ICD-10-CM

## 2015-04-22 DIAGNOSIS — I712 Thoracic aortic aneurysm, without rupture, unspecified: Secondary | ICD-10-CM

## 2015-04-22 DIAGNOSIS — G4700 Insomnia, unspecified: Secondary | ICD-10-CM

## 2015-04-22 DIAGNOSIS — E119 Type 2 diabetes mellitus without complications: Secondary | ICD-10-CM | POA: Diagnosis not present

## 2015-04-22 DIAGNOSIS — G4710 Hypersomnia, unspecified: Secondary | ICD-10-CM

## 2015-04-22 DIAGNOSIS — R400 Somnolence: Secondary | ICD-10-CM

## 2015-04-22 LAB — POCT GLYCOSYLATED HEMOGLOBIN (HGB A1C): Hemoglobin A1C: 6.1

## 2015-04-22 MED ORDER — ZOLPIDEM TARTRATE 10 MG PO TABS: 10.0000 mg | ORAL_TABLET | Freq: Every evening | ORAL | Status: DC | PRN

## 2015-04-22 MED ORDER — ZOLPIDEM TARTRATE 5 MG PO TABS: 5.0000 mg | ORAL_TABLET | Freq: Every evening | ORAL | Status: DC | PRN

## 2015-04-22 MED ORDER — METFORMIN HCL 500 MG PO TABS: 500.0000 mg | ORAL_TABLET | Freq: Two times a day (BID) | ORAL | Status: AC

## 2015-04-22 MED ORDER — MODAFINIL 200 MG PO TABS: 200.0000 mg | ORAL_TABLET | Freq: Every day | ORAL | Status: DC

## 2015-04-22 MED ORDER — MUPIROCIN 2 % EX OINT: TOPICAL_OINTMENT | CUTANEOUS | Status: DC

## 2015-04-22 NOTE — Progress Notes (Signed)
   Subjective:    Patient ID: Justin Day, male    DOB: [birth year], 74 y.o.   MRN: [id number]  HPI  Diabetes - no hypoglycemic events. No wounds or sores that are not healing well. No increased thirst or urination. Checking glucose at home. Taking medications as prescribed without any side effects. They hopefully will be reducing prednisone.   Had botox for the eyes again last week. Last time didn't get as goo effect. They also did injection for coughing and choking.   Not sleeping well. Having recurrent dream/nightmares.  Says he wants to get up when this happen. He gets violent if having nightmares.  This is happening most nights of the week.  Would like to try a higher dose of Ambien.  Now taking the 5mg  dose.    Hoping to get a short term Rx for Provigil [facility].      Review of Systems     Objective:   Physical Exam  Constitutional: He is oriented to person, place, and time. He appears well-developed and well-nourished.  HENT:  Head: Normocephalic and atraumatic.  Cardiovascular: Normal rate, regular rhythm and normal heart sounds.   Pulmonary/Chest: Effort normal and breath sounds normal.  Neurological: He is alert and oriented to person, place, and time.  Skin: Skin is warm and dry.  Psychiatric: He has a normal mood and affect. His behavior is normal.          Assessment & Plan:  DM- well controlled. F/U in 3 months. Continue current regimen.  Hopefully will be reducing steroid dose soon to 17.5mg  daily..   Daytime somnolence - refilled 10 days of Provigil. His wife tries to keep him awake during the daytime.   INsomnia with nightmares - His wife would like to try 10mg  of Ambien. Will try higher dose. MOnitor for excess daytime sedation.    [facility].  Just had botox done again.    Temporal arteritis with thoracic aneurysm - continuing steroids.

## 2015-04-25 ENCOUNTER — Telehealth: Payer: Self-pay | Admitting: Family Medicine

## 2015-04-25 NOTE — Telephone Encounter (Signed)
Received fax for prior authorization on Modafinil 200 mg tablets sent through cover my meds and received authorization. Case ID [id number]. Pharmacy was notified. - CF

## 2015-05-01 ENCOUNTER — Other Ambulatory Visit: Payer: Self-pay | Admitting: Family Medicine

## 2015-05-01 ENCOUNTER — Telehealth: Payer: Self-pay

## 2015-05-01 LAB — MICROALBUMIN / CREATININE URINE RATIO: CREATININE, URINE: 76 mg/dL (ref 20–370)

## 2015-05-01 MED ORDER — MODAFINIL 200 MG PO TABS: 200.0000 mg | ORAL_TABLET | Freq: Every day | ORAL | Status: DC

## 2015-05-01 NOTE — Telephone Encounter (Signed)
-----   Message from Agapito Gamesatherine D Metheney, MD sent at 05/01/2015  8:07 AM EST ----- Call pt: no protein in the urine.

## 2015-05-01 NOTE — Telephone Encounter (Signed)
Left message informing patient of negative protein in urine.

## 2015-05-08 ENCOUNTER — Other Ambulatory Visit: Payer: Self-pay | Admitting: Family Medicine

## 2015-05-08 DIAGNOSIS — G231 Progressive supranuclear ophthalmoplegia [Steele-Richardson-Olszewski]: Secondary | ICD-10-CM

## 2015-05-08 DIAGNOSIS — I776 Arteritis, unspecified: Secondary | ICD-10-CM

## 2015-05-21 ENCOUNTER — Other Ambulatory Visit: Payer: Self-pay | Admitting: Family Medicine

## 2015-06-27 ENCOUNTER — Other Ambulatory Visit: Payer: Self-pay | Admitting: *Deleted

## 2015-06-27 MED ORDER — CICLOPIROX 8 % EX SOLN: Freq: Every day | CUTANEOUS | Status: DC

## 2015-07-01 ENCOUNTER — Other Ambulatory Visit: Payer: Self-pay | Admitting: Family Medicine

## 2015-07-15 ENCOUNTER — Ambulatory Visit (INDEPENDENT_AMBULATORY_CARE_PROVIDER_SITE_OTHER): Payer: Commercial Managed Care - HMO

## 2015-07-15 ENCOUNTER — Encounter: Payer: Self-pay | Admitting: Osteopathic Medicine

## 2015-07-15 ENCOUNTER — Other Ambulatory Visit: Payer: Self-pay | Admitting: Family Medicine

## 2015-07-15 ENCOUNTER — Ambulatory Visit (INDEPENDENT_AMBULATORY_CARE_PROVIDER_SITE_OTHER): Payer: Medicare PPO | Admitting: Osteopathic Medicine

## 2015-07-15 VITALS — BP 124/66 | HR 87 | Wt 133.0 lb

## 2015-07-15 DIAGNOSIS — R079 Chest pain, unspecified: Secondary | ICD-10-CM

## 2015-07-15 DIAGNOSIS — R0602 Shortness of breath: Secondary | ICD-10-CM | POA: Diagnosis not present

## 2015-07-15 DIAGNOSIS — R05 Cough: Secondary | ICD-10-CM | POA: Diagnosis not present

## 2015-07-15 DIAGNOSIS — R0689 Other abnormalities of breathing: Secondary | ICD-10-CM

## 2015-07-15 NOTE — Progress Notes (Signed)
HPI: Justin Day is a 75 y.o. male who presents to [facility]  today for chief complaint of:  Chief Complaint  Patient presents with  . Breathing Problem    History obtained with assistance from wife, patient has dementia . Quality: Wife reports he was up several times during the night, thinks complaining of chest pain, clutching chest, maybe gasping a few times, was worse with lying down, better when sitting up . Assoc signs/symptoms: No coughing, no fever. . Duration: 1 days, started last night, since about 9 AM this morning the patient has been feeling okay . Context:   Patient has history of swallow dysfunction, hasn't noticed it getting any worse lately, hasn't choked on anything   Past medical, social and family history reviewed. Current medications and allergies reviewed.     Review of Systems: Unable to be obtained due to patient condition, he denies pain or difficulty breathing right now   Exam:  BP 124/66 mmHg  Pulse 87  Wt 133 lb (60.328 kg)  SpO2 100% Constitutional: VSS, see above. General Appearance: alert, well-developed, well-nourished, NAD Eyes: Normal lids and conjunctive, non-icteric sclera Neck: No masses, trachea midline. normal lymph nodes Respiratory: Normal respiratory effort. No  wheeze/rhonchi/rales Cardiovascular: S1/S2 normal, no murmur/rub/gallop auscultated. RRR. Gastrointestinal: Nontender, bowel sounds normal    EKG interpretation: Rate: 77 Rhythm: sinus No ST/T changes concerning for acute ischemia/infarct  Positive artifact in inferior leads limits interpretation Compared to previous EKG 08/08/2014, no significant change  Last echocardiogram July 2016, EF normal, no concerns  CXR on personal review Cardiomediastinal silhouette/heart size: normal Obvious bony abnormality: none Infiltrate: none Mass or other opacity: none Atelectasis: none Diaphragms: normal Lateral view: normal  Compared with  previous chest x-ray, no significant changes. Pt wife counseled that radiologist will review the images as well, our office will call if the formal read reveals any significant findings other than what has been noted above.   Dg Chest 2 View  07/15/2015  CLINICAL DATA:  Cough. Chest pain since last night. Question aspiration. EXAM: CHEST  2 VIEW COMPARISON:  04/15/2015 FINDINGS: Cholecystectomy. Midline trachea. Patient rotated minimally right on the frontal. Normal heart size and mediastinal contours. No pleural effusion or pneumothorax. Clear lungs. IMPRESSION: No acute cardiopulmonary disease. Electronically Signed   By: Jeronimo Greaves M.D.   On: 07/15/2015 15:47  Pt's wife called and informed    ASSESSMENT/PLAN: Exam not concerning for respiratory abnormality/infection, no clinical signs of heart failure, will get chest x-ray for completeness since inspiratory effort was poor and did limit physical exam. Advised wife have a low suspicion for cardiac/pulmonary issues right now but of course he would need to go to ER to definitively rule out coronary artery disease/ACS. If he gets worse would probably recommend doing so.  Could also consider GI causes such as esophageal spasm/GERD. History of recurrent pericardial effusion, he is status post pericardial window. He does have history of aortic aneurysm, not a good surgical candidate, left CCA was canceled, no further workup planned, wife aware that if aortic aneurysm ever does have dissection/rupture this will almost certainly result in death. Basically, if symptoms continue, would consider GI workup/repeat swallow study, I'm hesitant to do anything like a barium barium swallow at this moment given the patient's long-standing swallowing difficulty and clinical picture is stable right now. Would also consider follow-up with cardiology for possible stress test. Not sure how much his movement disorder/dementia may be playing a role here, would consider  follow-up with neurology  Given transient nature of symptoms, will keep an eye on patient unless something turns up on the x-ray. ER/RTC precautions reviewed, he has a follow-up appointment with PCP in one week.     Difficulty breathing - Plan: EKG 12-Lead, DG Chest 2 View,     Return if symptoms worsen or fail to improve otherwise keep appointmetn with PCP.

## 2015-07-15 NOTE — Telephone Encounter (Signed)
Has appointment coming up at the 3/27

## 2015-07-17 ENCOUNTER — Telehealth: Payer: Self-pay

## 2015-07-17 DIAGNOSIS — R05 Cough: Secondary | ICD-10-CM

## 2015-07-17 DIAGNOSIS — R059 Cough, unspecified: Secondary | ICD-10-CM

## 2015-07-17 NOTE — Telephone Encounter (Signed)
We can order a sputum culture and she can pick up the cut downstairs and submitted downstairs when she collects the specimen if she would like. The chest x-ray did not show pneumonia which is reassuring.

## 2015-07-17 NOTE — Telephone Encounter (Signed)
Notified patient's wife

## 2015-07-18 ENCOUNTER — Other Ambulatory Visit: Payer: Self-pay | Admitting: Physician Assistant

## 2015-07-18 DIAGNOSIS — M316 Other giant cell arteritis: Secondary | ICD-10-CM

## 2015-07-19 ENCOUNTER — Other Ambulatory Visit: Payer: Self-pay | Admitting: Rheumatology

## 2015-07-19 ENCOUNTER — Other Ambulatory Visit: Payer: Self-pay | Admitting: Physician Assistant

## 2015-07-19 DIAGNOSIS — M316 Other giant cell arteritis: Secondary | ICD-10-CM

## 2015-07-22 ENCOUNTER — Encounter: Payer: Self-pay | Admitting: Family Medicine

## 2015-07-22 ENCOUNTER — Ambulatory Visit (INDEPENDENT_AMBULATORY_CARE_PROVIDER_SITE_OTHER): Payer: Commercial Managed Care - HMO | Admitting: Family Medicine

## 2015-07-22 VITALS — BP 121/55 | HR 84 | Wt 137.0 lb

## 2015-07-22 DIAGNOSIS — I714 Abdominal aortic aneurysm, without rupture, unspecified: Secondary | ICD-10-CM

## 2015-07-22 DIAGNOSIS — E119 Type 2 diabetes mellitus without complications: Secondary | ICD-10-CM

## 2015-07-22 DIAGNOSIS — I776 Arteritis, unspecified: Secondary | ICD-10-CM | POA: Diagnosis not present

## 2015-07-22 DIAGNOSIS — G608 Other hereditary and idiopathic neuropathies: Secondary | ICD-10-CM | POA: Diagnosis not present

## 2015-07-22 DIAGNOSIS — G231 Progressive supranuclear ophthalmoplegia [Steele-Richardson-Olszewski]: Secondary | ICD-10-CM

## 2015-07-22 LAB — POCT GLYCOSYLATED HEMOGLOBIN (HGB A1C): Hemoglobin A1C: 5.4

## 2015-07-22 MED ORDER — MEMANTINE HCL 5 MG PO TABS: ORAL_TABLET | ORAL | Status: AC

## 2015-07-22 NOTE — Progress Notes (Signed)
   Subjective:    Patient ID: Justin Day, male    DOB: 27-Mar-1941, 75 y.o.   MRN: [id number]  HPI Diabetes - no hypoglycemic events. No wounds or sores that are not healing well. No increased thirst or urination. Checking glucose at home. Taking medications as prescribed without any side effects.He is on 10 units of Toujeo and metformin. She has been skipping Toujeo if she evening glucose is under 125.   Supranuclear palsy- they discontinued his Namenda and he is now on Aricept 5 mg. He had previously tried Aricept at the 10 mg dose and experienced dizziness.  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 They did reduce his dose to 5 mg daily.  The rheumatologist plans to repeat CT scan in mid April and if stable they will stop the Bactrim. If it's worse than they'll restart Rituxan and stay on the Bactrim.  Review of Systems     Objective:   Physical Exam  Constitutional: He is oriented to person, place, and time. He appears well-developed and well-nourished.  HENT:  Head: Normocephalic and atraumatic.  Cardiovascular: Normal rate, regular rhythm and normal heart sounds.   Pulmonary/Chest: Effort normal and breath sounds normal.  Neurological: He is alert and oriented to person, place, and time.  Skin: Skin is warm and dry.  Psychiatric: He has a normal mood and affect. His behavior is normal.          Assessment & Plan:  DM- well controlled. A1C is5.4 and he is down . We will discontinue the insulin since he's only taking 10 units. I think the reduction in his prednisone has made a big difference in controlling his blood sugars. Follow back up in 3 months. Foot exam performed today.  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  We discussed that we can certainly add back Namenda to the Aricept if she would like. They stopped the Aricept because extended release was extremely expensive. I explained that if he is willing to take it twice a day but it's much less  expensive as it is available generic. She is willing to try it but wants to start with a low dose. We'll start with 5 mg daily for 1 week and then increase to twice a day. We can adjust the dose when I see him back.  Abdominal aortic aneurysm with aortitis - continue with daily prednisone. Being followed by rheumatology. Currently on Bactrim as well. Repeat CT scan scheduled for April.  Due for Pneumovax 23.

## 2015-07-25 ENCOUNTER — Ambulatory Visit (INDEPENDENT_AMBULATORY_CARE_PROVIDER_SITE_OTHER): Payer: Commercial Managed Care - HMO

## 2015-07-25 ENCOUNTER — Ambulatory Visit (INDEPENDENT_AMBULATORY_CARE_PROVIDER_SITE_OTHER): Payer: Medicare PPO | Admitting: Family Medicine

## 2015-07-25 ENCOUNTER — Encounter: Payer: Self-pay | Admitting: Family Medicine

## 2015-07-25 VITALS — BP 121/72 | HR 85 | Wt 136.0 lb

## 2015-07-25 DIAGNOSIS — M542 Cervicalgia: Secondary | ICD-10-CM

## 2015-07-25 DIAGNOSIS — W19XXXA Unspecified fall, initial encounter: Secondary | ICD-10-CM

## 2015-07-25 DIAGNOSIS — S0990XA Unspecified injury of head, initial encounter: Secondary | ICD-10-CM

## 2015-07-25 DIAGNOSIS — M4184 Other forms of scoliosis, thoracic region: Secondary | ICD-10-CM | POA: Diagnosis not present

## 2015-07-25 NOTE — Patient Instructions (Signed)
Thank you for coming in today. Use aleve and a heating pad for pain as needed.  Follow up with Dr Linford ArnoldMetheney.  Return sooner if needed.   Fall Prevention in the Home  Falls can cause injuries and can affect people from all age groups. There are many simple things that you can do to make your home safe and to help prevent falls. WHAT CAN I DO ON THE OUTSIDE OF MY HOME?  Regularly repair the edges of walkways and driveways and fix any cracks.  Remove high doorway thresholds.  Trim any shrubbery on the main path into your home.  Use bright outdoor lighting.  Clear walkways of debris and clutter, including tools and rocks.  Regularly check that handrails are securely fastened and in good repair. Both sides of any steps should have handrails.  Install guardrails along the edges of any raised decks or porches.  Have leaves, snow, and ice cleared regularly.  Use sand or salt on walkways during winter months.  In the garage, clean up any spills right away, including grease or oil spills. WHAT CAN I DO IN THE BATHROOM?  Use night lights.  Install grab bars by the toilet and in the tub and shower. Do not use towel bars as grab bars.  Use non-skid mats or decals on the floor of the tub or shower.  If you need to sit down while you are in the shower, use a plastic, non-slip stool.Marland Kitchen.  Keep the floor dry. Immediately clean up any water that spills on the floor.  Remove soap buildup in the tub or shower on a regular basis.  Attach bath mats securely with double-sided non-slip rug tape.  Remove throw rugs and other tripping hazards from the floor. WHAT CAN I DO IN THE BEDROOM?  Use night lights.  Make sure that a bedside light is easy to reach.  Do not use oversized bedding that drapes onto the floor.  Have a firm chair that has side arms to use for getting dressed.  Remove throw rugs and other tripping hazards from the floor. WHAT CAN I DO IN THE KITCHEN?   Clean up any spills  right away.  Avoid walking on wet floors.  Place frequently used items in easy-to-reach places.  If you need to reach for something above you, use a sturdy step stool that has a grab bar.  Keep electrical cables out of the way.  Do not use floor polish or wax that makes floors slippery. If you have to use wax, make sure that it is non-skid floor wax.  Remove throw rugs and other tripping hazards from the floor. WHAT CAN I DO IN THE STAIRWAYS?  Do not leave any items on the stairs.  Make sure that there are handrails on both sides of the stairs. Fix handrails that are broken or loose. Make sure that handrails are as long as the stairways.  Check any carpeting to make sure that it is firmly attached to the stairs. Fix any carpet that is loose or worn.  Avoid having throw rugs at the top or bottom of stairways, or secure the rugs with carpet tape to prevent them from moving.  Make sure that you have a light switch at the top of the stairs and the bottom of the stairs. If you do not have them, have them installed. WHAT ARE SOME OTHER FALL PREVENTION TIPS?  Wear closed-toe shoes that fit well and support your feet. Wear shoes that have rubber soles or  low heels.  When you use a stepladder, make sure that it is completely opened and that the sides are firmly locked. Have someone hold the ladder while you are using it. Do not climb a closed stepladder.  Add color or contrast paint or tape to grab bars and handrails in your home. Place contrasting color strips on the first and last steps.  Use mobility aids as needed, such as canes, walkers, scooters, and crutches.  Turn on lights if it is dark. Replace any light bulbs that burn out.  Set up furniture so that there are clear paths. Keep the furniture in the same spot.  Fix any uneven floor surfaces.  Choose a carpet design that does not hide the edge of steps of a stairway.  Be aware of any and all pets.  Review your medicines  with your healthcare provider. Some medicines can cause dizziness or changes in blood pressure, which increase your risk of falling. Talk with your health care provider about other ways that you can decrease your risk of falls. This may include working with a physical therapist or trainer to improve your strength, balance, and endurance.   This information is not intended to replace advice given to you by your health care provider. Make sure you discuss any questions you have with your health care provider.   Document Released: 04/10/2002 Document Revised: 09/04/2014 Document Reviewed: 05/25/2014 Elsevier Interactive Patient Education [employer]

## 2015-07-26 NOTE — Progress Notes (Signed)
Justin Day is a 75 y.o. male who presents to [facility] today for fall. Patient fell backwards in the shower yesterday. He had his head and his upper neck. He notes pain in his upper thoracic low cervical spine. He has not had significant change in activity per his wife. He feels well but notes mild pain. No medications tried yet.   Past Medical History  Diagnosis Date  . 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 Temporal arteritis ([place]) 09/29/13  . Pericardial effusion 07/20/13  . Gangrenous cholecystitis 07/02/13  . Shingles 2003  . Pericardial effusion   . 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 Thoracic aortic aneurysm [facility])    Past Surgical History  Procedure Laterality Date  . Fasciectomy  08/2006    Hand Dupuytrens, left   . Nasal septoplasty w/ turbinoplasty  08/2002  . Knee surgery    . Pericardial window    . Tonsillectomy    . Cholecystectomy     Social History  Substance Use Topics  . Smoking status: Never Smoker   . Smokeless tobacco: Not on file  . Alcohol Use: No   family history includes Breast cancer in his mother; Diabetes in his father; Heart attack in his father; Hyperlipidemia in his father; Hypertension in his father.  ROS as above Medications: Current Outpatient Prescriptions  Medication Sig Dispense Refill  . AMBULATORY NON FORMULARY MEDICATION Medication Name: Bed and Chair alarm. Dx: supranuclear palsy. 1 Units PRN  . aspirin 81 MG tablet Take 81 mg by mouth daily.    . Blood Glucose Monitoring Suppl (TRUE METRIX AIR GLUCOSE METER) W/DEVICE KIT 1 kit by Does not apply route 3 (three) times daily. Use as directed with insulin Three (3) times a day. Dx: DM type 2 controlled. Dx code:E11.9 1 kit 0  . Cholecalciferol (D 1000 PO) Take by mouth.    . ciclopirox (PENLAC) 8 % solution Apply topically at bedtime. Apply over nail and surrounding skin. Apply daily over  previous coat. After seven (7) days, may remove with alcohol and continue cycle. 6.6 mL 0  . Cyanocobalamin (VITAMIN B12 PO) Take by mouth.    Marland Kitchen glucose blood (TRUE METRIX BLOOD GLUCOSE TEST) test strip Use as directed with insulin Three (3) times a day. Dx: DM type 2 controlled. Dx code:E11.9 100 each 12  . Insulin Glargine (TOUJEO SOLOSTAR) 300 UNIT/ML SOPN Inject 10 Units into the skin at bedtime. 1.5 mL 3  . Insulin Pen Needle 31G X 6 MM MISC Use as directed with insulin Three (3) times a day.  Dx: DM type 2 controlled. Dx code:E11.9 100 each 11  . memantine (NAMENDA) 5 MG tablet Start 51m PO once a day x 1 week, then increase to BID 60 tablet 3  . metFORMIN (GLUCOPHAGE) 500 MG tablet Take 1 tablet (500 mg total) by mouth 2 (two) times daily with a meal. 60 tablet 5  . modafinil (PROVIGIL) 200 MG tablet Take 1 tablet (200 mg total) by mouth daily. 90 tablet 1  . predniSONE (DELTASONE) 5 MG tablet Take 10 mg by mouth daily.  3  . PRODIGY TWIST TOP LANCETS 28G MISC test FOUR TIMES DAILY 100 each 2  . riTUXimab in sodium chloride 0.9 % 250 mL Inject into the vein.    .Marland Kitchenzolpidem (AMBIEN) 10 MG tablet TAKE 1 TABLET AT BEDTIME AS NEEDED SLEEP 30 tablet 0   No current facility-administered medications for this visit.  Allergies  Allergen Reactions  . Peanut-Containing Drug Products Anaphylaxis  . Aricept [Donepezil Hcl] Other (See Comments)    Dizziness.   . Carbidopa-Levodopa Other (See Comments)    pericarditis     Exam:  BP 121/72 mmHg  Pulse 85  Wt 136 lb (61.689 kg) Gen: Well NAD HEENT: EOMI,  MMM Lungs: Normal work of breathing. CTABL Heart: RRR no MRG Abd: NABS, Soft. Nondistended, Nontender Exts: Brisk capillary refill, warm and well perfused. MSK: High thoracic and low cervical spine mildly tender to touch. Decreased neck motion due to pain. Scalp is nontender with no significant ecchymosis. Neuro: Mask faces present. Slow motion. Moves all extremities. Unchanged from  baseline.   No results found for this or any previous visit (from the past 24 hour(s)). Dg Thoracic Spine 2 View  07/25/2015  CLINICAL DATA:  Fall.  Initial evaluation. EXAM: THORACIC SPINE 2 VIEWS COMPARISON:  None. FINDINGS: Degenerative changes thoracic spine. No acute bony abnormality identified. Surgical clips right upper quadrant. IMPRESSION: Degenerative changes thoracic spine with mild scoliosis concave left. No acute abnormality identified. Electronically Signed   By: Marcello Moores  Register   On: 07/25/2015 15:14   Ct Head Wo Contrast  07/25/2015  CLINICAL DATA:  Fall getting out of shower yesterday, hit left side of head. EXAM: CT HEAD WITHOUT CONTRAST CT CERVICAL SPINE WITHOUT CONTRAST TECHNIQUE: Multidetector CT imaging of the head and cervical spine was performed following the standard protocol without intravenous contrast. Multiplanar CT image reconstructions of the cervical spine were also generated. COMPARISON:  None. FINDINGS: CT HEAD FINDINGS There is mild generalized brain atrophy with commensurate dilatation of the ventricles and sulci. Mild chronic small vessel ischemic change noted within the deep periventricular white matter regions bilaterally. There is no mass, hemorrhage, edema or other evidence of acute parenchymal abnormality. No extra-axial hemorrhage. Superficial soft tissues are unremarkable. No osseous fracture or dislocation. CT CERVICAL SPINE FINDINGS Degenerative change noted throughout the cervical spine, with slight disc space narrowings and degenerative osseous hypertrophy at multiple levels. Associated bridging ankylosis noted at the C3-4 level with associated minimal anterolisthesis of C4 on C5. No more than mild central canal stenosis seen at any level. Degenerative hypertrophy of the facet and uncovertebral joints are causing moderate left neural foramen stenosis at C4-5, moderate right neural foramen stenosis at C5-6, and moderate bilateral neural foramen stenoses at C6-7,  with possible associated nerve root impingements at each level. No osseous fracture line or displaced fracture fragment seen. Facets are appropriately positioned throughout. Paravertebral soft tissues are unremarkable. IMPRESSION: 1. No acute intracranial abnormality. No intracranial mass, hemorrhage or edema. No skull fracture. 2. No fracture or acute subluxation within the cervical spine. Degenerative changes within the cervical spine, as detailed above. Electronically Signed   By: Franki Cabot M.D.   On: 07/25/2015 15:25   Ct Cervical Spine Wo Contrast  07/25/2015  CLINICAL DATA:  Fall getting out of shower yesterday, hit left side of head. EXAM: CT HEAD WITHOUT CONTRAST CT CERVICAL SPINE WITHOUT CONTRAST TECHNIQUE: Multidetector CT imaging of the head and cervical spine was performed following the standard protocol without intravenous contrast. Multiplanar CT image reconstructions of the cervical spine were also generated. COMPARISON:  None. FINDINGS: CT HEAD FINDINGS There is mild generalized brain atrophy with commensurate dilatation of the ventricles and sulci. Mild chronic small vessel ischemic change noted within the deep periventricular white matter regions bilaterally. There is no mass, hemorrhage, edema or other evidence of acute parenchymal abnormality. No extra-axial hemorrhage. Superficial  soft tissues are unremarkable. No osseous fracture or dislocation. CT CERVICAL SPINE FINDINGS Degenerative change noted throughout the cervical spine, with slight disc space narrowings and degenerative osseous hypertrophy at multiple levels. Associated bridging ankylosis noted at the C3-4 level with associated minimal anterolisthesis of C4 on C5. No more than mild central canal stenosis seen at any level. Degenerative hypertrophy of the facet and uncovertebral joints are causing moderate left neural foramen stenosis at C4-5, moderate right neural foramen stenosis at C5-6, and moderate bilateral neural foramen  stenoses at C6-7, with possible associated nerve root impingements at each level. No osseous fracture line or displaced fracture fragment seen. Facets are appropriately positioned throughout. Paravertebral soft tissues are unremarkable. IMPRESSION: 1. No acute intracranial abnormality. No intracranial mass, hemorrhage or edema. No skull fracture. 2. No fracture or acute subluxation within the cervical spine. Degenerative changes within the cervical spine, as detailed above. Electronically Signed   By: Franki Cabot M.D.   On: 07/25/2015 41:23    75 year old male with significant neurological impairment due to supranuclear palsy with fall and had neck injury. Fortunately no fracture or bleed seen on CT scan of the head and neck. T-spine x-rays are also normal. Plan for watchful waiting with heating pad and Tylenol for pain control. Follow-up with PCP.

## 2015-08-08 ENCOUNTER — Other Ambulatory Visit: Payer: Self-pay | Admitting: Family Medicine

## 2015-08-08 DIAGNOSIS — R748 Abnormal levels of other serum enzymes: Secondary | ICD-10-CM

## 2015-08-08 LAB — COMPLETE METABOLIC PANEL WITH GFR
ALT: 21 U/L (ref 9–46)
AST: 21 U/L (ref 10–35)
Albumin: 4.4 g/dL (ref 3.6–5.1)
Alkaline Phosphatase: 122 U/L — ABNORMAL HIGH (ref 40–115)
BUN: 11 mg/dL (ref 7–25)
CALCIUM: 9.5 mg/dL (ref 8.6–10.3)
CHLORIDE: 98 mmol/L (ref 98–110)
CO2: 28 mmol/L (ref 20–31)
Creat: 0.72 mg/dL (ref 0.70–1.18)
GFR, Est African American: >89 mL/min (ref >=60)
GLUCOSE: 96 mg/dL (ref 65–99)
POTASSIUM: 4.3 mmol/L (ref 3.5–5.3)
SODIUM: 139 mmol/L (ref 135–146)
Total Bilirubin: 0.8 mg/dL (ref 0.2–1.2)
Total Protein: 6.5 g/dL (ref 6.1–8.1)

## 2015-08-08 LAB — LIPID PANEL
CHOL/HDL RATIO: 2.6 ratio (ref <=5.0)
CHOLESTEROL: 178 mg/dL (ref 125–200)
HDL: 69 mg/dL (ref >=40)
LDL Cholesterol: 78 mg/dL (ref <130)
Triglycerides: 155 mg/dL — ABNORMAL HIGH (ref <150)
VLDL: 31 mg/dL — AB (ref <30)

## 2015-08-08 LAB — TSH: TSH: 0.94 mIU/L (ref 0.40–4.50)

## 2015-08-19 ENCOUNTER — Ambulatory Visit
Admission: RE | Admit: 2015-08-19 | Discharge: 2015-08-19 | Disposition: A | Discharge status: Home / Self Care | Payer: Commercial Managed Care - HMO | Source: Ambulatory Visit | Attending: Rheumatology | Admitting: Rheumatology

## 2015-08-19 DIAGNOSIS — M316 Other giant cell arteritis: Secondary | ICD-10-CM

## 2015-08-19 MED ORDER — IOPAMIDOL (ISOVUE-370) INJECTION 76%
80.0000 mL | Freq: Once | INTRAVENOUS | Status: AC | PRN
  Administered 2015-08-19: 80 mL via INTRAVENOUS

## 2015-08-26 ENCOUNTER — Other Ambulatory Visit: Payer: Self-pay | Admitting: Family Medicine

## 2015-09-05 ENCOUNTER — Telehealth: Payer: Self-pay | Admitting: Family Medicine

## 2015-09-05 NOTE — Telephone Encounter (Signed)
Called Pt's Wife Renea Ee([name]) to follow up on Pt's recent hospital stay. Wife advised Pt was in the hospital for 9 days due to femur fracture (caused by a fall). Pt had the ball replaced in his hip. Pt also suffered from aspiration pneumonia, Pt is doing better but xray on hospital discharge still showed signs of pneumonia. Pt also failed the swallow test, so he is on pureed foods which he is refusing to eat. Pt is at [facility] now, wife hopes he will only be there for 15 days.

## 2015-09-11 ENCOUNTER — Telehealth: Payer: Self-pay | Admitting: *Deleted

## 2015-09-11 NOTE — Telephone Encounter (Signed)
Pt called and stated that she will be taking her husband home and is considering hospice. She wanted to know if Dr. Linford ArnoldMetheney would still be able to be his pcp. I informed her that I wasn't sure if the orders had to come from the rehab facility or if Dr. Linford ArnoldMetheney could write for them. Either way we would do whatever we could to assist her thru this transition. I told her that Dr. Linford ArnoldMetheney would not see this note until Monday. She voiced understanding.Heath GoldBarkley, Allean Montfort [name]'

## 2015-09-11 NOTE — Telephone Encounter (Signed)
Justin Day w/hospice called and stated that pt's wife had called and informed her that she is ready to proceed with hospice care. I gave her a VO to begin care for him.Justin PacasBarkley, Justin Day BallplayLynetta

## 2015-09-13 ENCOUNTER — Telehealth: Payer: Self-pay

## 2015-09-13 NOTE — Telephone Encounter (Signed)
Patient has been admitted to in home hospice care.

## 2015-09-19 ENCOUNTER — Other Ambulatory Visit: Payer: Self-pay | Admitting: Family Medicine

## 2015-09-19 MED ORDER — PREDNISONE 1 MG PO TABS: 1.0000 mg | ORAL_TABLET | Freq: Every day | ORAL | Status: AC

## 2015-09-20 ENCOUNTER — Telehealth: Payer: Self-pay | Admitting: Family Medicine

## 2015-09-20 NOTE — Telephone Encounter (Signed)
Beverly from hospice called, Pt has a fever with no tylenol order on file. They request an order for: Tylenol 650mg  q6 po prn pain/fever greater than 100 degrees. Verbal order given. No further questions.

## 2015-10-03 DEATH — deceased

## 2015-10-14 ENCOUNTER — Ambulatory Visit: Payer: Commercial Managed Care - HMO | Admitting: Family Medicine

## 2015-10-18 ENCOUNTER — Ambulatory Visit: Payer: Commercial Managed Care - HMO | Admitting: Family Medicine

## 2015-10-21 ENCOUNTER — Ambulatory Visit: Payer: Commercial Managed Care - HMO | Admitting: Family Medicine

## 2015-10-22 ENCOUNTER — Ambulatory Visit: Payer: Medicare PPO | Admitting: Family Medicine

## 2017-03-25 IMAGING — CT CT ANGIO CHEST
1 series · 18 of 32 positions shown · IV contrast (APPLIED)
Comparison: 03/21/2015

ADDENDUM:
Voice recognition error in the clinical data section of this report.
Clinical data should read " giant cell arteritis".
CLINICAL DATA: Is still days ago toe exactly

EXAM:
CT ANGIOGRAPHY CHEST WITH CONTRAST
TECHNIQUE: Multidetector CT imaging of the chest was performed using the
standard protocol during bolus administration of intravenous
contrast. Multiplanar CT image reconstructions and MIPs were
obtained to evaluate the vascular anatomy.
CONTRAST:  80 cc Isovue 370

[Series 4: chest angio · axial · 0.62mm/px · z∈[-341,-59]mm · 18 of 102 slices shown]
[im 4/102  lung]
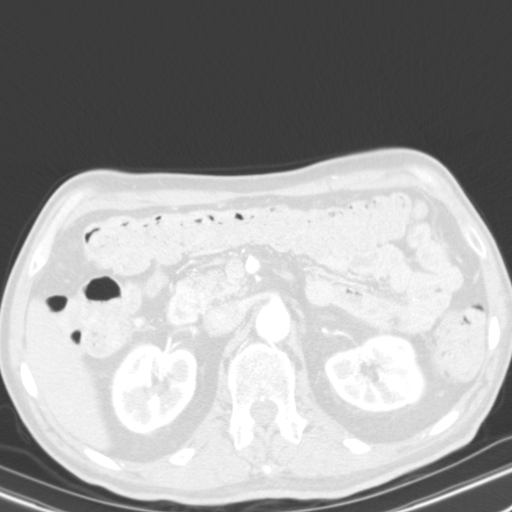
[im 10/102  soft-tissue]
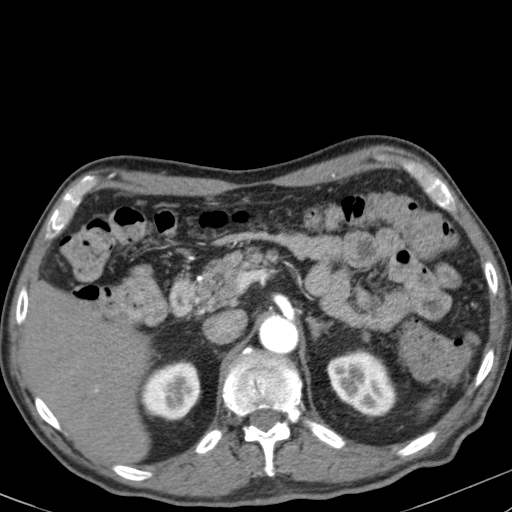
[im 17/102  lung]
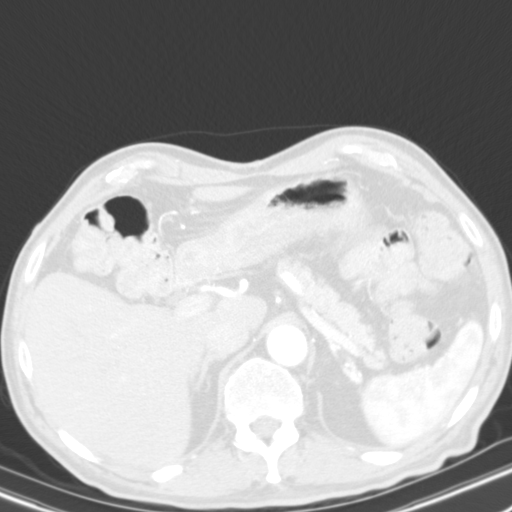
[im 20/102  soft-tissue]
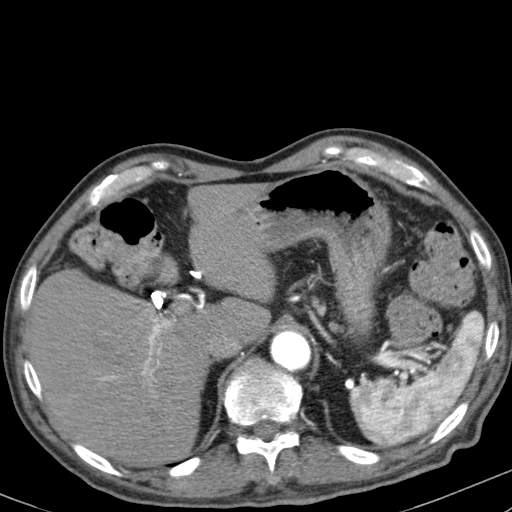
[im 27/102  lung]
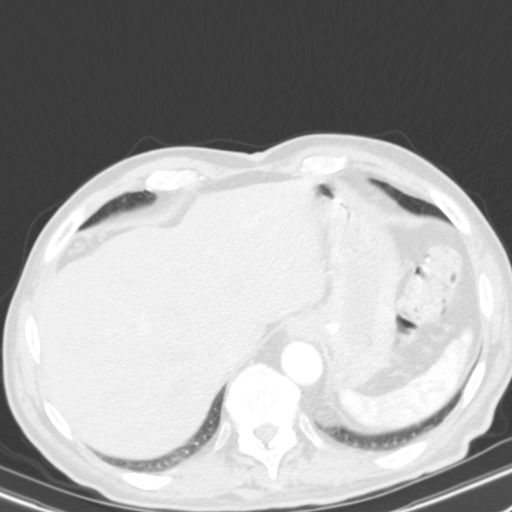
[im 33/102  soft-tissue]
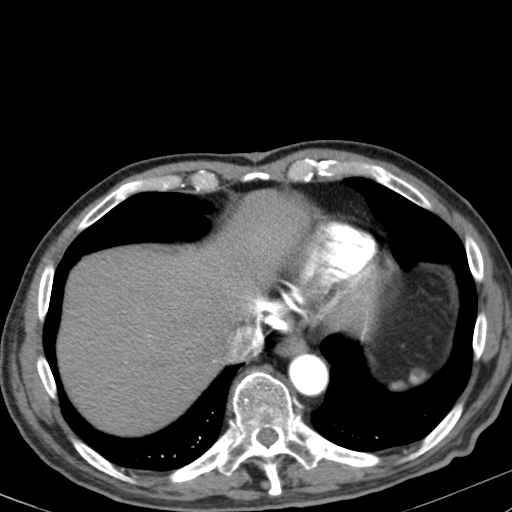
[im 36/102  lung]
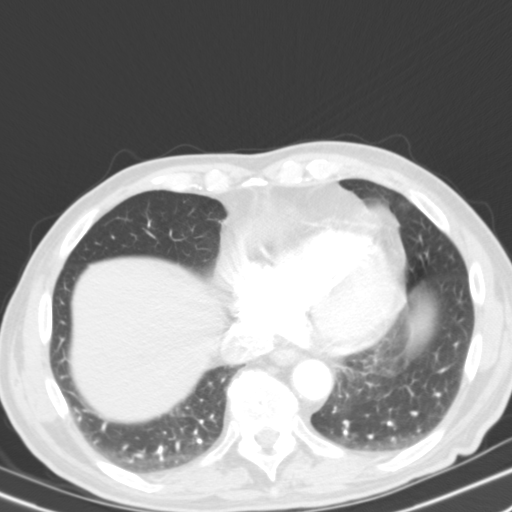
[im 43/102  soft-tissue]
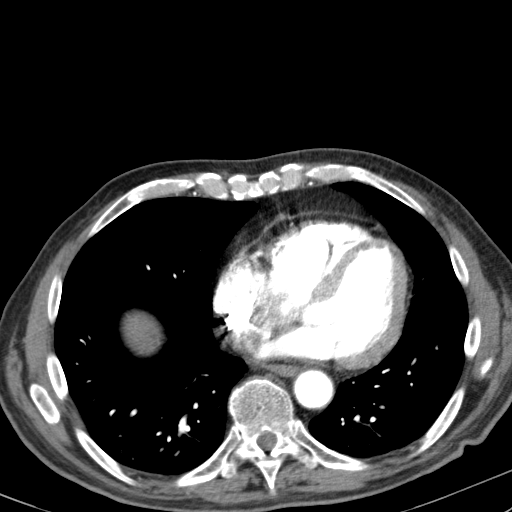
[im 49/102  lung]
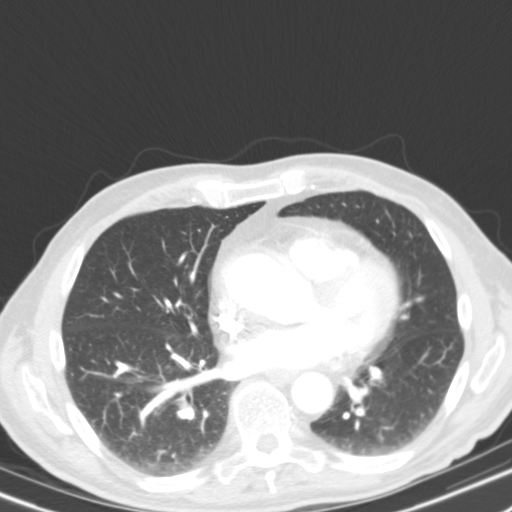
[im 53/102  soft-tissue]
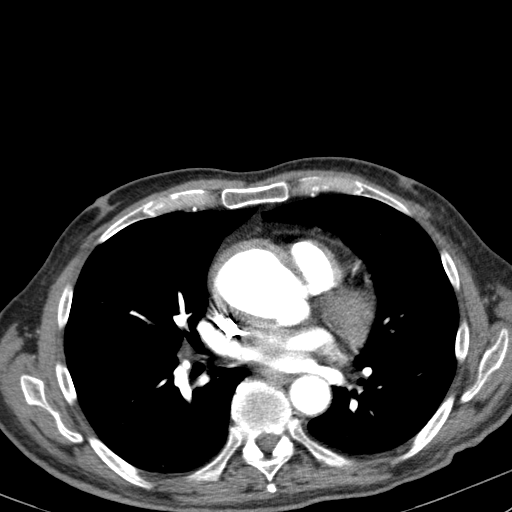
[im 59/102  lung]
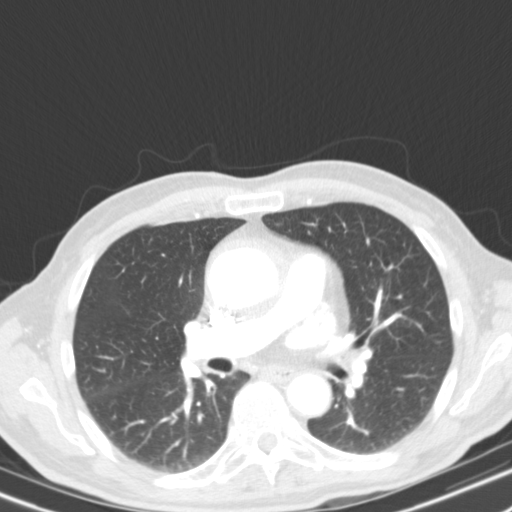
[im 66/102  soft-tissue]
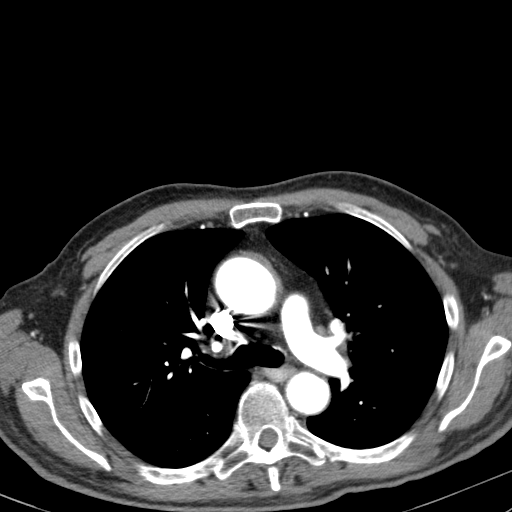
[im 69/102  lung]
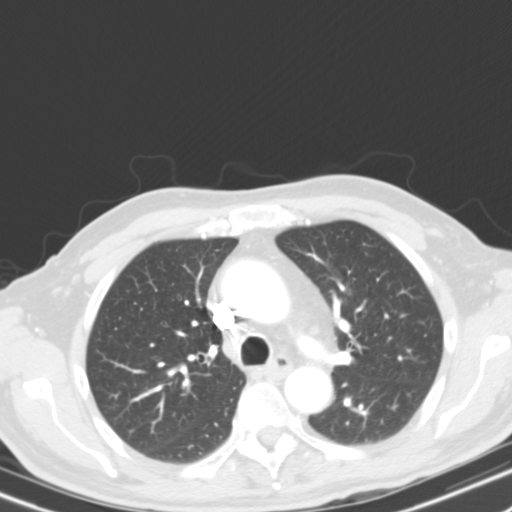
[im 75/102  soft-tissue]
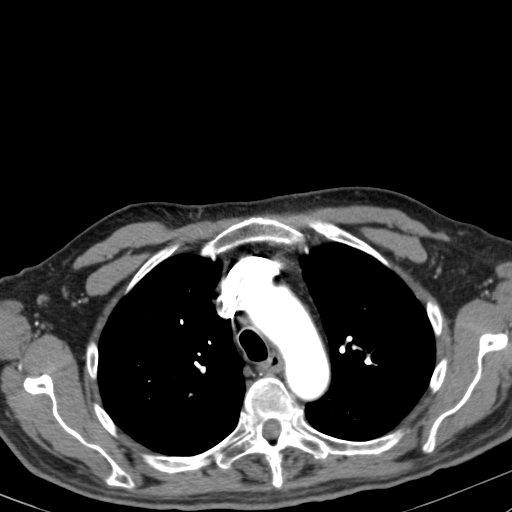
[im 82/102  lung]
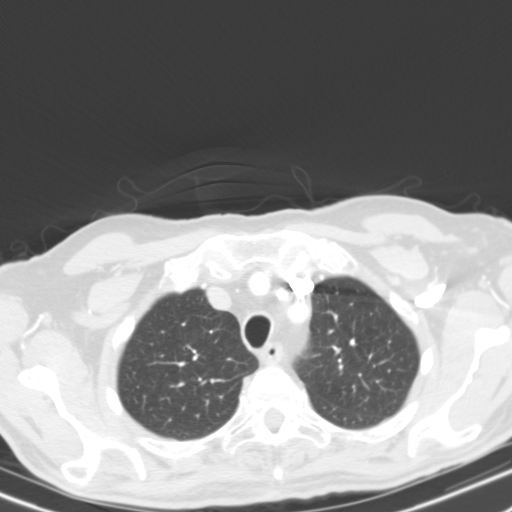
[im 85/102  soft-tissue]
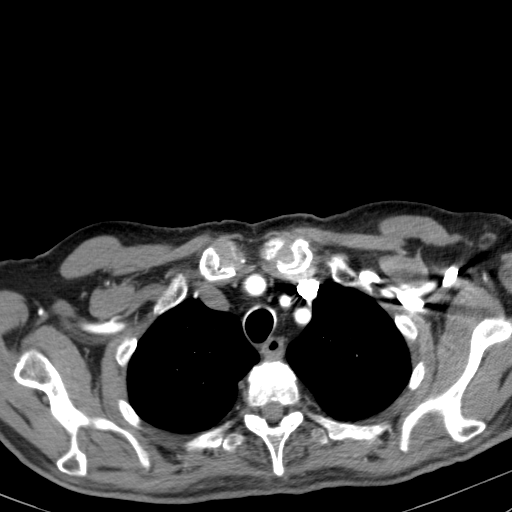
[im 92/102  lung]
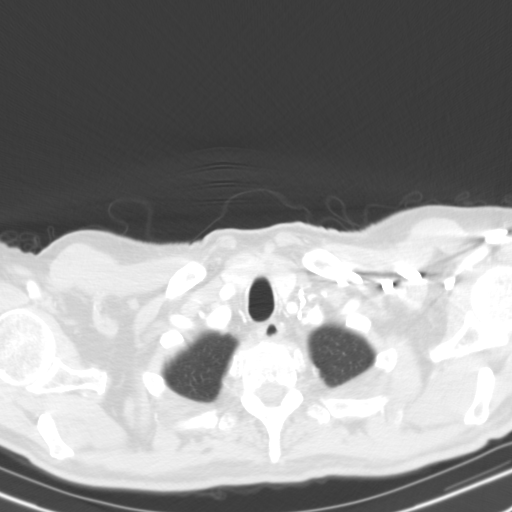
[im 98/102  soft-tissue]
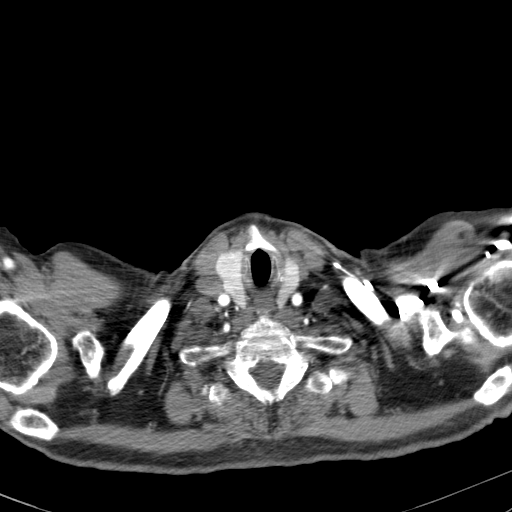

[18 of 32 positions shown; findings below may reference images not displayed]

FINDINGS: Mediastinum / Lymph Nodes: There is no axillary lymphadenopathy.
Stable 9 mm anterior right thyroid nodule. No mediastinal
lymphadenopathy. There is no hilar lymphadenopathy. Dilatation of
the ascending aorta is again noted with circumferential wall
thickening. Measurements obtained 2 date are carefully placed at the
same levels as on the previous study. Non gated assessment of the
ascending thoracic aorta reveals transverse orthogonal diameters at
the level of the right main pulmonary artery of 4.8 x 4.6 cm which
compares to 4.6 x 4.5 cm previously. Maximum measurement obtained on
the previous study is just distal to the [place] junction and
was obtained on axial imaging. That measurement was 5.2 cm which
compares to 5.3 cm obtained at the same level and in the same
dimension on today's study. The heart size is normal. No pericardial
effusion. No evidence of pericardial effusion. The esophagus has
normal imaging features.

Lungs / Pleura: No suspicious pulmonary nodule or mass. No focal
airspace consolidation. No pulmonary edema or pleural effusion.

Upper Abdomen: Gallbladder is surgically absent. Otherwise
unremarkable.

[HOSPITAL] / Soft Tissues: Bone windows reveal no worrisome lytic or
sclerotic osseous lesions.

Review of the MIP images confirms the above findings.
IMPRESSION: 1. Minimal interval increase in size of the ascending thoracic aorto
with maximum diameter of 5.3 cm when measuring at the same level and
in the same plane as on the prior study. Ascending thoracic aortic
aneurysm. Recommend semi-annual imaging followup by CTA or MRA and
referral to cardiothoracic surgery if not already obtained. This
recommendation follows [id number]
ACCF/AHA/AATS/ACR/ASA/SCA/AUJLA/STANEK/AMNON/MOMIN Guidelines for the
Diagnosis and Management of Patients With Thoracic Aortic Disease.
Circulation. 9696; 121: [id number].
2. Status post cholecystectomy.
